# Patient Record
Sex: Female | Born: 1990 | Race: White | Hispanic: No | Marital: Married | Smoking: Never smoker
Health system: Southern US, Community
[De-identification: ages and names within clinical notes are randomized; demographics above are authoritative.]

## PROBLEM LIST (undated history)

## (undated) DIAGNOSIS — I10 Essential (primary) hypertension: Secondary | ICD-10-CM

## (undated) DIAGNOSIS — H9190 Unspecified hearing loss, unspecified ear: Secondary | ICD-10-CM

## (undated) HISTORY — PX: APPENDECTOMY: SHX54

## (undated) HISTORY — PX: OTHER SURGICAL HISTORY: SHX169

## (undated) HISTORY — PX: EAR CYST EXCISION: SHX22

## (undated) HISTORY — DX: Essential (primary) hypertension: I10

## (undated) HISTORY — DX: Unspecified hearing loss, unspecified ear: H91.90

---
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
## 2018-11-01 LAB — HM PAP SMEAR: HM Pap smear: NORMAL

## 2018-12-03 ENCOUNTER — Telehealth: Payer: Self-pay | Admitting: Obstetrics & Gynecology

## 2018-12-03 NOTE — Telephone Encounter (Signed)
[facility] referring for Health maintenance examination. Called and left voicemail for patient to call back to be schedule

## 2018-12-03 NOTE — Patient Instructions (Signed)
I value your feedback and entrusting us with your care. If you get a Deputy patient survey, I would appreciate you taking the time to let us know about your experience today. Thank you! 

## 2018-12-03 NOTE — Progress Notes (Addendum)
PCP:  Patient, No Pcp Per   Chief Complaint  Patient presents with  . Gynecologic Exam     HPI:      Ms. Casey Richardson is a 28 y.o. No obstetric history on file. who LMP was Patient's last menstrual period was 11/14/2018 (exact date)., presents today for her NP annual examination.  Her menses are regular every 28-30 days, lasting 4 days.  Dysmenorrhea mild, occurring first 1-2 days of flow. She does not have intermenstrual bleeding.  Sex activity: never sexually active. Getting married 7/20. Only interested in non-hormonal BC in future. Declines BC currently. Hx of HTN. Last Pap: neg Hx of STDs: none  There is no FH of breast cancer. There is a FH of ovarian cancer in her mat cousin, genetic testing not indicated for pt. The patient does do self-breast exams.  Tobacco use: The patient denies current or previous tobacco use. Alcohol use: social drinker No drug use.  Exercise: moderately active  She does get adequate calcium and Vitamin D in her diet.   Past Medical History:  Diagnosis Date  . HTN (hypertension)     Past Surgical History:  Procedure Laterality Date  . APPENDECTOMY  1999    Family History  Problem Relation Age of Onset  . Thyroid disease Mother   . Esophageal cancer Maternal Grandfather   . Colon cancer Paternal Grandmother   . Esophageal cancer Paternal Grandfather   . Hypertension Paternal Grandfather   . Ovarian cancer Cousin 30    Social History   Socioeconomic History  . Marital status: Single    Spouse name: Not on file  . Number of children: Not on file  . Years of education: Not on file  . Highest education level: Not on file  Occupational History  . Not on file  Social Needs  . Financial resource strain: Not on file  . Food insecurity:    Worry: Not on file    Inability: Not on file  . Transportation needs:    Medical: Not on file    Non-medical: Not on file  Tobacco Use  . Smoking status: Never Smoker  . Smokeless  tobacco: Never Used  Substance and Sexual Activity  . Alcohol use: Yes  . Drug use: Never  . Sexual activity: Never    Birth control/protection: None  Lifestyle  . Physical activity:    Days per week: Not on file    Minutes per session: Not on file  . Stress: Not on file  Relationships  . Social connections:    Talks on phone: Not on file    Gets together: Not on file    Attends religious service: Not on file    Active member of club or organization: Not on file    Attends meetings of clubs or organizations: Not on file    Relationship status: Not on file  . Intimate partner violence:    Fear of current or ex partner: Not on file    Emotionally abused: Not on file    Physically abused: Not on file    Forced sexual activity: Not on file  Other Topics Concern  . Not on file  Social History Narrative  . Not on file    Outpatient Medications Prior to Visit  Medication Sig Dispense Refill  . fluticasone (FLONASE) 50 MCG/ACT nasal spray Place into the nose.    Marland Kitchen. GARLIC OIL PO Take by mouth.    . losartan (COZAAR) 25 MG tablet Take by  mouth.    . Multiple Vitamin (MULTI-VITAMIN DAILY PO) Take by mouth.     No facility-administered medications prior to visit.       ROS:  Review of Systems  Constitutional: Negative for fatigue, fever and unexpected weight change.  Respiratory: Negative for cough, shortness of breath and wheezing.   Cardiovascular: Negative for chest pain, palpitations and leg swelling.  Gastrointestinal: Negative for blood in stool, constipation, diarrhea, nausea and vomiting.  Endocrine: Negative for cold intolerance, heat intolerance and polyuria.  Genitourinary: Negative for dyspareunia, dysuria, flank pain, frequency, genital sores, hematuria, menstrual problem, pelvic pain, urgency, vaginal bleeding, vaginal discharge and vaginal pain.  Musculoskeletal: Negative for back pain, joint swelling and myalgias.  Skin: Negative for rash.  Neurological:  Negative for dizziness, syncope, light-headedness, numbness and headaches.  Hematological: Negative for adenopathy.  Psychiatric/Behavioral: Negative for agitation, confusion, sleep disturbance and suicidal ideas. The patient is not nervous/anxious.    BREAST: No symptoms   Objective: BP 118/80   Ht  (1.702 m)   Wt 225 lb 6.4 oz (102.2 kg)   LMP 11/14/2018 (Exact Date)   BMI 35.30 kg/m    Physical Exam Constitutional:      Appearance: She is well-developed.  Genitourinary:     Vulva, vagina, cervix, uterus, right adnexa and left adnexa normal.     No vulval lesion or tenderness noted.     No vaginal discharge, erythema or tenderness.     No cervical polyp.     Uterus is not enlarged or tender.     No right or left adnexal mass present.     Right adnexa not tender.     Left adnexa not tender.  Neck:     Musculoskeletal: Normal range of motion.     Thyroid: No thyromegaly.  Cardiovascular:     Rate and Rhythm: Normal rate and regular rhythm.     Heart sounds: Normal heart sounds. No murmur.  Pulmonary:     Effort: Pulmonary effort is normal.     Breath sounds: Normal breath sounds.  Chest:     Breasts:        Right: No mass, nipple discharge, skin change or tenderness.        Left: No mass, nipple discharge, skin change or tenderness.  Abdominal:     Palpations: Abdomen is soft.     Tenderness: There is no abdominal tenderness. There is no guarding.  Musculoskeletal: Normal range of motion.  Neurological:     General: No focal deficit present.     Mental Status: She is alert and oriented to person, place, and time.     Cranial Nerves: No cranial nerve deficit.  Skin:    General: Skin is warm and dry.  Psychiatric:        Mood and Affect: Mood normal.        Behavior: Behavior normal.        Thought Content: Thought content normal.        Judgment: Judgment normal.  Vitals signs reviewed.     Assessment/Plan: Encounter for annual routine gynecological  examination  Cervical cancer screening - Plan: Cytology - PAP  Encounter for other general counseling or advice on contraception - Non-hormonal BC options discussed. Pt to f/u prn.          GYN counsel use and side effects of OCP's, adequate intake of calcium and vitamin D     F/U  Return in about 1 year (around 12/04/2019).  Helmut Muster  B. Dalvin Clipper, PA-C 12/04/2018 9:57 AM

## 2018-12-04 ENCOUNTER — Other Ambulatory Visit: Payer: Self-pay

## 2018-12-04 ENCOUNTER — Encounter: Payer: Self-pay | Admitting: Obstetrics and Gynecology

## 2018-12-04 ENCOUNTER — Telehealth: Payer: Self-pay

## 2018-12-04 ENCOUNTER — Ambulatory Visit (INDEPENDENT_AMBULATORY_CARE_PROVIDER_SITE_OTHER): Payer: Self-pay | Admitting: Obstetrics and Gynecology

## 2018-12-04 ENCOUNTER — Other Ambulatory Visit (HOSPITAL_COMMUNITY)
Admission: RE | Admit: 2018-12-04 | Discharge: 2018-12-04 | Disposition: A | Discharge status: Home / Self Care | Payer: Self-pay | Source: Ambulatory Visit | Attending: Obstetrics and Gynecology | Admitting: Obstetrics and Gynecology

## 2018-12-04 VITALS — BP 118/80 | Ht 67.0 in | Wt 225.4 lb

## 2018-12-04 DIAGNOSIS — Z124 Encounter for screening for malignant neoplasm of cervix: Secondary | ICD-10-CM | POA: Insufficient documentation

## 2018-12-04 DIAGNOSIS — Z30011 Encounter for initial prescription of contraceptive pills: Secondary | ICD-10-CM

## 2018-12-04 DIAGNOSIS — Z01419 Encounter for gynecological examination (general) (routine) without abnormal findings: Secondary | ICD-10-CM

## 2018-12-04 DIAGNOSIS — Z3009 Encounter for other general counseling and advice on contraception: Secondary | ICD-10-CM

## 2018-12-04 NOTE — Telephone Encounter (Signed)
Pt calling; had appt this am; bc discussed; thinks she may want a low dose bcp.  [phone]

## 2018-12-04 NOTE — Telephone Encounter (Signed)
Please see below.

## 2018-12-05 MED ORDER — DROSPIRENONE 4 MG PO TABS: 1.0000 | ORAL_TABLET | Freq: Every day | ORAL | 3 refills | Status: DC

## 2018-12-05 NOTE — Telephone Encounter (Signed)
Pt would like BCP. Hx of HTN. Discussed POPs. Will leave 2 samples/coupon card Slynd at front desk to start with next menses. Rx eRxd to [facility]. Start with next menses, condoms for 1 wk. Pt trying to delay menses due to start several days after wedding. Pt to call me when starts pills to see if we need to adjust dosing for honeymoon.

## 2018-12-10 LAB — CYTOLOGY - PAP: Diagnosis: NEGATIVE

## 2018-12-19 ENCOUNTER — Telehealth: Payer: Self-pay

## 2018-12-19 NOTE — Telephone Encounter (Signed)
Pt started slynd 6/14. Wedding sched for 7/10 (during placebo pills). Take placebo pills 1 wk early (01/01/19) and then resume active pills. See if prevents bleeding for wedding/honeymoon. F/u prn.

## 2018-12-19 NOTE — Telephone Encounter (Signed)
Please see

## 2018-12-19 NOTE — Telephone Encounter (Signed)
Pt calling; started bc on Sunday; was told to call the 1st week of taking it.  [phone]

## 2019-01-31 ENCOUNTER — Telehealth: Payer: Self-pay

## 2019-01-31 NOTE — Telephone Encounter (Signed)
Spoke w/pt. Advised to complete current pack and then stop. She doesn't feel well on the birth control. That is the only thing she has changed. Wants to see if she will feel better off the birth control. She will call if she desires to restart birth control.

## 2019-01-31 NOTE — Telephone Encounter (Signed)
Pt would like to stop her OCP's. She is inquiring if there are any specifics she needs to know before stopping them. [phone]

## 2019-10-28 ENCOUNTER — Telehealth: Payer: Self-pay | Admitting: Family Medicine

## 2019-10-28 NOTE — Telephone Encounter (Signed)
Copied from CRM [id number]. Topic: General - Other >> Oct 28, 2019 12:15 PM Dalphine Handing A wrote: Patient stated that she is fully vaccinated and found out that she was exposed to covid . Patient wants to know if she can still attend her appt in person.   Called pt to get more details, she had a bridal shower at her house Friday and found out Sunday one of the girls were positive for Covid, switched NP appt to a virtual

## 2019-10-29 ENCOUNTER — Encounter: Payer: Self-pay | Admitting: Family Medicine

## 2019-10-29 ENCOUNTER — Telehealth (INDEPENDENT_AMBULATORY_CARE_PROVIDER_SITE_OTHER): Payer: Managed Care, Other (non HMO) | Admitting: Family Medicine

## 2019-10-29 VITALS — BP 127/80 | HR 73 | Ht 67.0 in | Wt 237.0 lb

## 2019-10-29 DIAGNOSIS — Z7689 Persons encountering health services in other specified circumstances: Secondary | ICD-10-CM | POA: Diagnosis not present

## 2019-10-29 DIAGNOSIS — I10 Essential (primary) hypertension: Secondary | ICD-10-CM | POA: Insufficient documentation

## 2019-10-29 MED ORDER — LABETALOL HCL 100 MG PO TABS: 50.0000 mg | ORAL_TABLET | Freq: Two times a day (BID) | ORAL | 0 refills | Status: DC

## 2019-10-29 NOTE — Progress Notes (Signed)
BP 127/80   Pulse 73   Ht 5\' 7"  (1.702 m)   Wt 237 lb (107.5 kg)   LMP 10/01/2019 (Exact Date)   BMI 37.12 kg/m    Subjective:    Patient ID: 10/[id number], female    DOB: 11-Aug-1990, 29 y.o.   MRN: [id number]  HPI: Casey Richardson is a 29 y.o. female  Chief Complaint  Patient presents with  . Establish Care    . This visit was completed via MyChart due to the restrictions of the COVID-19 pandemic. All issues as above were discussed and addressed. Physical exam was done as above through visual confirmation on MyChart. If it was felt that the patient should be evaluated in the office, they were directed there. The patient verbally consented to this visit. . Location of the patient: home . Location of the provider: work . Those involved with this call:  . Provider: 26, PA-C . CMA: Roosvelt Maser, CMA . Front Desk/Registration: Elton Sin  . Time spent on call: 15 minutes with patient face to face via video conference. More than 50% of this time was spent in counseling and coordination of care. 5 minutes total spent in review of patient's record and preparation of their chart. I verified patient identity using two factors (patient name and date of birth). Patient consents verbally to being seen via telemedicine visit today.   Patient presenting today to establish care.   PMHx significant for HTN, currently well controlled on low dose losartan. Due for refill, tolerating well without side effects. Of note, wanting to start trying to conceive very soon. Denies CP, SOB, HAs, dizziness.   No other known medical problems and not on any medications otherwise.   Relevant past medical, surgical, family and social history reviewed and updated as indicated. Interim medical history since our last visit reviewed. Allergies and medications reviewed and updated.  Review of Systems  Per HPI unless specifically indicated above     Objective:    BP 127/80   Pulse 73    Ht 5\' 7"  (1.702 m)   Wt 237 lb (107.5 kg)   LMP 10/01/2019 (Exact Date)   BMI 37.12 kg/m   Wt Readings from Last 3 Encounters:  10/29/19 237 lb (107.5 kg)    Physical Exam Vitals and nursing note reviewed.  Constitutional:      General: She is not in acute distress.    Appearance: Normal appearance.  HENT:     Head: Atraumatic.     Right Ear: External ear normal.     Left Ear: External ear normal.     Nose: Nose normal. No congestion.     Mouth/Throat:     Mouth: Mucous membranes are moist.     Pharynx: Oropharynx is clear. No posterior oropharyngeal erythema.  Eyes:     Extraocular Movements: Extraocular movements intact.     Conjunctiva/sclera: Conjunctivae normal.  Cardiovascular:     Comments: Unable to assess via virtual visit Pulmonary:     Effort: Pulmonary effort is normal. No respiratory distress.  Musculoskeletal:        General: Normal range of motion.     Cervical back: Normal range of motion.  Skin:    General: Skin is dry.     Findings: No erythema.  Neurological:     Mental Status: She is alert and oriented to person, place, and time.  Psychiatric:        Mood and Affect: Mood normal.  Thought Content: Thought content normal.        Judgment: Judgment normal.     No results found for this or any previous visit.    Assessment & Plan:   Problem List Items Addressed This Visit      Cardiovascular and Mediastinum   Essential hypertension - Primary    Will transition to labetalol given her desire to conceive in the near future. Check home readings carefully, call with persistent abnormal readings. Recheck 1 month      Relevant Medications   labetalol (NORMODYNE) 100 MG tablet    Other Visit Diagnoses    Encounter to establish care           Follow up plan: Return in about 4 weeks (around 11/26/2019) for BP, CPE.

## 2019-10-29 NOTE — Telephone Encounter (Signed)
Agree with virtual to be safe

## 2019-10-30 ENCOUNTER — Encounter: Payer: Self-pay | Admitting: Family Medicine

## 2019-11-03 ENCOUNTER — Encounter: Payer: Self-pay | Admitting: Family Medicine

## 2019-11-03 ENCOUNTER — Telehealth: Payer: Self-pay | Admitting: Family Medicine

## 2019-11-03 NOTE — Telephone Encounter (Signed)
-----   Message from Particia Nearing, [place] sent at 10/29/2019  5:09 PM EDT ----- 1 month HTN, CPE

## 2019-11-03 NOTE — Telephone Encounter (Signed)
lvm to make this apt. Sent letter.  

## 2019-11-09 NOTE — Assessment & Plan Note (Signed)
Will transition to labetalol given her desire to conceive in the near future. Check home readings carefully, call with persistent abnormal readings. Recheck 1 month

## 2019-11-20 ENCOUNTER — Other Ambulatory Visit: Payer: Self-pay

## 2019-11-20 ENCOUNTER — Encounter: Payer: Self-pay | Admitting: Certified Nurse Midwife

## 2019-11-20 ENCOUNTER — Ambulatory Visit: Payer: Managed Care, Other (non HMO) | Admitting: Certified Nurse Midwife

## 2019-11-20 VITALS — BP 136/78 | HR 66 | Ht 67.0 in | Wt 242.2 lb

## 2019-11-20 DIAGNOSIS — Z7689 Persons encountering health services in other specified circumstances: Secondary | ICD-10-CM

## 2019-11-20 DIAGNOSIS — I10 Essential (primary) hypertension: Secondary | ICD-10-CM

## 2019-11-20 DIAGNOSIS — Z01419 Encounter for gynecological examination (general) (routine) without abnormal findings: Secondary | ICD-10-CM

## 2019-11-20 NOTE — Patient Instructions (Addendum)
Preparing for Pregnancy If you are considering becoming pregnant, make an appointment to see your regular health care provider to learn how to prepare for a safe and healthy pregnancy (preconception care). During a preconception care visit, your health care provider will:  Do a complete physical exam, including a Pap test.  Take a complete medical history.  Give you information, answer your questions, and help you resolve problems. Preconception checklist Medical history  Tell your health care provider about any current or past medical conditions. Your pregnancy or your ability to become pregnant may be affected by chronic conditions, such as diabetes, chronic hypertension, and thyroid problems.  Include your family's medical history as well as your partner's medical history.  Tell your health care provider about any history of STIs (sexually transmitted infections).These can affect your pregnancy. In some cases, they can be passed to your baby. Discuss any concerns that you have about STIs.  If indicated, discuss the benefits of genetic testing. This testing will show whether there are any genetic conditions that may be passed from you or your partner to your baby.  Tell your health care provider about: ? Any problems you have had with conception or pregnancy. ? Any medicines you take. These include vitamins, herbal supplements, and over-the-counter medicines. ? Your history of immunizations. Discuss any vaccinations that you may need. Diet  Ask your health care provider what to include in a healthy diet that has a balance of nutrients. This is especially important when you are pregnant or preparing to become pregnant.  Ask your health care provider to help you reach a healthy weight before pregnancy. ? If you are overweight, you may be at higher risk for certain complications, such as high blood pressure, diabetes, and preterm birth. ? If you are underweight, you are more likely to  have a baby who has a low birth weight. Lifestyle, work, and home  Let your health care provider know: ? About any lifestyle habits that you have, such as alcohol use, drug use, or smoking. ? About recreational activities that may put you at risk during pregnancy, such as downhill skiing and certain exercise programs. ? Tell your health care provider about any international travel, especially any travel to places with an active [place] virus outbreak. ? About harmful substances that you may be exposed to at work or at home. These include chemicals, pesticides, radiation, or even litter boxes. ? If you do not feel safe at home. Mental health  Tell your health care provider about: ? Any history of mental health conditions, including feelings of depression, sadness, or anxiety. ? Any medicines that you take for a mental health condition. These include herbs and supplements. Home instructions to prepare for pregnancy Lifestyle   Eat a balanced diet. This includes fresh fruits and vegetables, whole grains, lean meats, low-fat dairy products, healthy fats, and foods that are high in fiber. Ask to meet with a nutritionist or registered dietitian for assistance with meal planning and goals.  Get regular exercise. Try to be active for at least 30 minutes a day on most days of the week. Ask your health care provider which activities are safe during pregnancy.  Do not use any products that contain nicotine or tobacco, such as cigarettes and e-cigarettes. If you need help quitting, ask your health care provider.  Do not drink alcohol.  Do not take illegal drugs.  Maintain a healthy weight. Ask your health care provider what weight range is right for you. General  instructions  Keep an accurate record of your menstrual periods. This makes it easier for your health care provider to determine your baby's due date.  Begin taking prenatal vitamins and folic acid supplements daily as directed by your  health care provider.  Manage any chronic conditions, such as high blood pressure and diabetes, as told by your health care provider. This is important. How do I know that I am pregnant? You may be pregnant if you have been sexually active and you miss your period. Symptoms of early pregnancy include:  Mild cramping.  Very light vaginal bleeding (spotting).  Feeling unusually tired.  Nausea and vomiting (morning sickness). If you have any of these symptoms and you suspect that you might be pregnant, you can take a home pregnancy test. These tests check for a hormone in your urine (human chorionic gonadotropin, or hCG). A woman's body begins to make this hormone during early pregnancy. These tests are very accurate. Wait until at least the first day after you miss your period to take one. If the test shows that you are pregnant (you get a positive result), call your health care provider to make an appointment for prenatal care. What should I do if I become pregnant?      Make an appointment with your health care provider as soon as you suspect you are pregnant.  Do not use any products that contain nicotine, such as cigarettes, chewing tobacco, and e-cigarettes. If you need help quitting, ask your health care provider.  Do not drink alcoholic beverages. Alcohol is related to a number of birth defects.  Avoid toxic odors and chemicals.  You may continue to have sexual intercourse if it does not cause pain or other problems, such as vaginal bleeding. This information is not intended to replace advice given to you by your health care provider. Make sure you discuss any questions you have with your health care provider. Document Revised: 06/21/2017 Document Reviewed: 01/09/2016 Elsevier Patient Education  2020 Elsevier Inc.    Preventive Care 21-39 Years Old, Female Preventive care refers to visits with your health care provider and lifestyle choices that can promote health and  wellness. This includes:  A yearly physical exam. This may also be called an annual well check.  Regular dental visits and eye exams.  Immunizations.  Screening for certain conditions.  Healthy lifestyle choices, such as eating a healthy diet, getting regular exercise, not using drugs or products that contain nicotine and tobacco, and limiting alcohol use. What can I expect for my preventive care visit? Physical exam Your health care provider will check your:  Height and weight. This may be used to calculate body mass index (BMI), which tells if you are at a healthy weight.  Heart rate and blood pressure.  Skin for abnormal spots. Counseling Your health care provider may ask you questions about your:  Alcohol, tobacco, and drug use.  Emotional well-being.  Home and relationship well-being.  Sexual activity.  Eating habits.  Work and work environment.  Method of birth control.  Menstrual cycle.  Pregnancy history. What immunizations do I need?  Influenza (flu) vaccine  This is recommended every year. Tetanus, diphtheria, and pertussis (Tdap) vaccine  You may need a Td booster every 10 years. Varicella (chickenpox) vaccine  You may need this if you have not been vaccinated. Human papillomavirus (HPV) vaccine  If recommended by your health care provider, you may need three doses over 6 months. Measles, mumps, and rubella (MMR) vaccine    You may need at least one dose of MMR. You may also need a second dose. Meningococcal conjugate (MenACWY) vaccine  One dose is recommended if you are age 19-21 years and a first-year college student living in a residence hall, or if you have one of several medical conditions. You may also need additional booster doses. Pneumococcal conjugate (PCV13) vaccine  You may need this if you have certain conditions and were not previously vaccinated. Pneumococcal polysaccharide (PPSV23) vaccine  You may need one or two doses if you  smoke cigarettes or if you have certain conditions. Hepatitis A vaccine  You may need this if you have certain conditions or if you travel or work in places where you may be exposed to hepatitis A. Hepatitis B vaccine  You may need this if you have certain conditions or if you travel or work in places where you may be exposed to hepatitis B. Haemophilus influenzae type b (Hib) vaccine  You may need this if you have certain conditions. You may receive vaccines as individual doses or as more than one vaccine together in one shot (combination vaccines). Talk with your health care provider about the risks and benefits of combination vaccines. What tests do I need?  Blood tests  Lipid and cholesterol levels. These may be checked every 5 years starting at age 20.  Hepatitis C test.  Hepatitis B test. Screening  Diabetes screening. This is done by checking your blood sugar (glucose) after you have not eaten for a while (fasting).  Sexually transmitted disease (STD) testing.  BRCA-related cancer screening. This may be done if you have a family history of breast, ovarian, tubal, or peritoneal cancers.  Pelvic exam and Pap test. This may be done every 3 years starting at age 21. Starting at age 30, this may be done every 5 years if you have a Pap test in combination with an HPV test. Talk with your health care provider about your test results, treatment options, and if necessary, the need for more tests. Follow these instructions at home: Eating and drinking   Eat a diet that includes fresh fruits and vegetables, whole grains, lean protein, and low-fat dairy.  Take vitamin and mineral supplements as recommended by your health care provider.  Do not drink alcohol if: ? Your health care provider tells you not to drink. ? You are pregnant, may be pregnant, or are planning to become pregnant.  If you drink alcohol: ? Limit how much you have to 0-1 drink a day. ? Be aware of how much  alcohol is in your drink. In the [place] one drink equals one 12 oz bottle of beer (355 mL), one 5 oz glass of wine (148 mL), or one 1 oz glass of hard liquor (44 mL). Lifestyle  Take daily care of your teeth and gums.  Stay active. Exercise for at least 30 minutes on 5 or more days each week.  Do not use any products that contain nicotine or tobacco, such as cigarettes, e-cigarettes, and chewing tobacco. If you need help quitting, ask your health care provider.  If you are sexually active, practice safe sex. Use a condom or other form of birth control (contraception) in order to prevent pregnancy and STIs (sexually transmitted infections). If you plan to become pregnant, see your health care provider for a preconception visit. What's next?  Visit your health care provider once a year for a well check visit.  Ask your health care provider how often you should have   your eyes and teeth checked.  Stay up to date on all vaccines. This information is not intended to replace advice given to you by your health care provider. Make sure you discuss any questions you have with your health care provider. Document Revised: 02/28/2018 Document Reviewed: 02/28/2018 Elsevier Patient Education  2020 Elsevier Inc.  

## 2019-11-20 NOTE — Progress Notes (Signed)
ANNUAL PREVENTATIVE CARE GYN  ENCOUNTER NOTE  Subjective:       Casey Richardson is a 29 y.o. G0P0000 female here for a routine annual gynecologic exam.  Current complaints: 1. Establish care-previously a patient of [facility]  Married last summer. Preparing for pregnancy.   Taking Labetalol twice daily without negative side effects.   Denies difficulty breathing or respiratory distress, chest pain, abdominal pain, excessive vaginal bleeding, dysuria, and leg pain or swelling.    Gynecologic History  Patient's last menstrual period was 10/29/2019 (exact date). Period Cycle (Days): 28 Period Duration (Days): 4 Period Pattern: Regular Menstrual Flow: Heavy, Moderate, Light Menstrual Control: Tampon, Other (Comment) Menstrual Control Change Freq (Hours): 2-3 Dysmenorrhea: (!) Mild Dysmenorrhea Symptoms: Cramping, Headache  Contraception: none  Last Pap: 2020. Results were: normal per patient  Obstetric History  OB History  Gravida Para Term Preterm AB Living  0 0 0 0 0 0  SAB TAB Ectopic Multiple Live Births  0 0 0 0 0    Past Medical History:  Diagnosis Date  . Hearing loss    right   . Hypertension     Past Surgical History:  Procedure Laterality Date  . APPENDECTOMY     20 years  . EAR CYST EXCISION    . nasal polyps      Current Outpatient Medications on File Prior to Visit  Medication Sig Dispense Refill  . labetalol (NORMODYNE) 100 MG tablet Take 0.5 tablets (50 mg total) by mouth 2 (two) times daily. 30 tablet 0   No current facility-administered medications on file prior to visit.    No Known Allergies  Social History   Socioeconomic History  . Marital status: Married    Spouse name: Not on file  . Number of children: Not on file  . Years of education: Not on file  . Highest education level: Not on file  Occupational History  . Not on file  Tobacco Use  . Smoking status: Never Smoker  . Smokeless tobacco: Never Used  Substance  and Sexual Activity  . Alcohol use: Yes  . Drug use: Never  . Sexual activity: Yes    Birth control/protection: None  Other Topics Concern  . Not on file  Social History Narrative  . Not on file   Social Determinants of Health   Financial Resource Strain:   . Difficulty of Paying Living Expenses:   Food Insecurity:   . Worried About Charity fundraiser in the Last Year:   . Arboriculturist in the Last Year:   Transportation Needs:   . Film/video editor (Medical):   Marland Kitchen Lack of Transportation (Non-Medical):   Physical Activity:   . Days of Exercise per Week:   . Minutes of Exercise per Session:   Stress:   . Feeling of Stress :   Social Connections:   . Frequency of Communication with Friends and Family:   . Frequency of Social Gatherings with Friends and Family:   . Attends Religious Services:   . Active Member of Clubs or Organizations:   . Attends Archivist Meetings:   Marland Kitchen Marital Status:   Intimate Partner Violence:   . Fear of Current or Ex-Partner:   . Emotionally Abused:   Marland Kitchen Physically Abused:   . Sexually Abused:     Family History  Problem Relation Age of Onset  . Hyperlipidemia Mother   . Hashimoto's thyroiditis Mother   . Hypothyroidism Mother   . Anxiety  disorder Brother   . Post-traumatic stress disorder Brother   . Cancer Maternal Uncle   . Arthritis Maternal Grandmother   . Cancer Maternal Grandfather   . Cancer Paternal Grandmother   . Anemia Paternal Grandmother   . Cancer Paternal Grandfather   . Aneurysm Maternal Uncle     The following portions of the patient's history were reviewed and updated as appropriate: allergies, current medications, past family history, past medical history, past social history, past surgical history and problem list.  Review of Systems  ROS negative except as noted above. Information obtained from patient.   Objective:   BP 136/78   Pulse 66   Ht 5\' 7"  (1.702 m)   Wt 242 lb 4 oz (109.9 kg)    LMP 10/29/2019 (Exact Date)   BMI 37.94 kg/m    CONSTITUTIONAL: Well-developed, well-nourished female in no acute distress.   PSYCHIATRIC: Normal mood and affect. Normal behavior. Normal judgment and thought content.  NEUROLGIC: Alert and oriented to person, place, and time. Normal muscle tone coordination. No cranial nerve deficit noted.  HENT:  Normocephalic, atraumatic, External right and left ear normal.   EYES: Conjunctivae and EOM are normal. Pupils are equal and round.   NECK: Normal range of motion, supple, no masses.  Normal thyroid.   SKIN: Skin is warm and dry. No rash noted. Not diaphoretic. No erythema. No pallor.  CARDIOVASCULAR: Normal heart rate noted, regular rhythm, no murmur.  RESPIRATORY: Clear to auscultation bilaterally. Effort and breath sounds normal, no problems with respiration noted.  BREASTS: Symmetric in size. No masses, skin changes, nipple drainage, or lymphadenopathy.  ABDOMEN: Soft, normal bowel sounds, no distention noted.  No tenderness, rebound or guarding.   PELVIC:  External Genitalia: Normal  Vagina: Normal  Cervix: Normal  Uterus: Normal  Adnexa: Normal  MUSCULOSKELETAL: Normal range of motion. No tenderness.  No cyanosis, clubbing, or edema.  2+ distal pulses.  LYMPHATIC: No Axillary, Supraclavicular, or Inguinal Adenopathy.  Assessment:   Annual gynecologic examination 29 y.o.   Contraception: none   Obesity 2   Problem List Items Addressed This Visit      Cardiovascular and Mediastinum   Essential hypertension    Other Visit Diagnoses    Well woman exam    -  Primary   Screening for cervical cancer       Encounter to establish care          Plan:   Pap: Not needed  Labs: Managed by PCP   Routine preventative health maintenance measures emphasized: Preparing pregnancy, Exercise/Diet/Weight control, Tobacco Warnings, Alcohol/Substance use risks and Stress Management; see AVS  Reviewed red flag symptoms and when  to call  Return to Clinic - 1 Year for 26 or sooner if needed   [employer], CNM [facility], [facility] 11/20/19 3:53 PM

## 2019-11-23 ENCOUNTER — Other Ambulatory Visit: Payer: Self-pay | Admitting: Family Medicine

## 2019-11-23 NOTE — Telephone Encounter (Signed)
Requested Prescriptions  Pending Prescriptions Disp Refills  . labetalol (NORMODYNE) 100 MG tablet [Pharmacy Med Name: LABETALOL HCL 100 MG TABLET] 30 tablet 0    Sig: TAKE 1/2 TABLET BY MOUTH TWICE A DAY     Cardiovascular:  Beta Blockers Passed - 11/23/2019  1:44 AM      Passed - Last BP in normal range    BP Readings from Last 1 Encounters:  11/20/19 136/78         Passed - Last Heart Rate in normal range    Pulse Readings from Last 1 Encounters:  11/20/19 66         Passed - Valid encounter within last 6 months    Recent Outpatient Visits          3 weeks ago Essential hypertension   [facility] Particia Nearing, [place]      Future Appointments            In 3 days Maurice March, Salley Hews, PA-C [facility]   In 1 year [place], Vanessa [place], CNM [facility]

## 2019-11-26 ENCOUNTER — Ambulatory Visit (INDEPENDENT_AMBULATORY_CARE_PROVIDER_SITE_OTHER): Payer: Managed Care, Other (non HMO) | Admitting: Family Medicine

## 2019-11-26 ENCOUNTER — Encounter: Payer: Self-pay | Admitting: Family Medicine

## 2019-11-26 ENCOUNTER — Other Ambulatory Visit: Payer: Self-pay

## 2019-11-26 VITALS — BP 128/85 | HR 69 | Temp 98.1°F | Wt 241.0 lb

## 2019-11-26 DIAGNOSIS — I10 Essential (primary) hypertension: Secondary | ICD-10-CM | POA: Diagnosis not present

## 2019-11-26 DIAGNOSIS — J309 Allergic rhinitis, unspecified: Secondary | ICD-10-CM | POA: Insufficient documentation

## 2019-11-26 DIAGNOSIS — J3089 Other allergic rhinitis: Secondary | ICD-10-CM

## 2019-11-26 NOTE — Progress Notes (Signed)
BP 128/85   Pulse 69   Temp 98.1 F (36.7 C) (Oral)   Wt 241 lb (109.3 kg)   LMP 10/29/2019 (Exact Date)   SpO2 98%   BMI 37.75 kg/m    Subjective:    Patient ID: Casey Richardson, female    DOB: 1991-05-06, 29 y.o.   MRN: [id number]  HPI: Casey Richardson is a 29 y.o. female  Chief Complaint  Patient presents with  . Hypertension   Here today for 1 month HTN f/u. Recently switched to labetalol as she's trying to get pregnant in near future. Tolerating very well so far, no side effects noted and feeling well. Home readings running 120s/80s range consistently.   Sneezing, dry throat, dry hacking cough since a nearby classroom hatched out baby chicks. She's also been spending more time outside lately. Hx of allergies, notes she started allegra this morning and already feeling much better. Denies fever, chills, SOB, wheezing, sick contacts.   Relevant past medical, surgical, family and social history reviewed and updated as indicated. Interim medical history since our last visit reviewed. Allergies and medications reviewed and updated.  Review of Systems  Per HPI unless specifically indicated above     Objective:    BP 128/85   Pulse 69   Temp 98.1 F (36.7 C) (Oral)   Wt 241 lb (109.3 kg)   LMP 10/29/2019 (Exact Date)   SpO2 98%   BMI 37.75 kg/m   Wt Readings from Last 3 Encounters:  11/26/19 241 lb (109.3 kg)  11/20/19 242 lb 4 oz (109.9 kg)  10/29/19 237 lb (107.5 kg)    Physical Exam Vitals and nursing note reviewed.  Constitutional:      Appearance: Normal appearance. She is not ill-appearing.  HENT:     Head: Atraumatic.     Right Ear: Tympanic membrane normal.     Left Ear: Tympanic membrane normal.     Nose: Nose normal.     Mouth/Throat:     Pharynx: Posterior oropharyngeal erythema present.  Eyes:     Extraocular Movements: Extraocular movements intact.     Conjunctiva/sclera: Conjunctivae normal.  Cardiovascular:     Rate and Rhythm:  Normal rate and regular rhythm.     Heart sounds: Normal heart sounds.  Pulmonary:     Effort: Pulmonary effort is normal.     Breath sounds: Normal breath sounds.  Musculoskeletal:        General: Normal range of motion.     Cervical back: Normal range of motion and neck supple.  Skin:    General: Skin is warm and dry.  Neurological:     Mental Status: She is alert and oriented to person, place, and time.  Psychiatric:        Mood and Affect: Mood normal.        Thought Content: Thought content normal.        Judgment: Judgment normal.     No results found for this or any previous visit.    Assessment & Plan:   Problem List Items Addressed This Visit      Cardiovascular and Mediastinum   Essential hypertension - Primary    Stable and under good control, continue current regimen        Respiratory   Allergic rhinitis    Suspect allergies causing sxs, continue allegra daily, can add flonase as well          Follow up plan: Return in about 6 months (around 05/28/2020) for  BP.

## 2019-11-26 NOTE — Assessment & Plan Note (Signed)
Stable and under good control, continue current regimen 

## 2019-11-26 NOTE — Assessment & Plan Note (Signed)
Suspect allergies causing sxs, continue allegra daily, can add flonase as well

## 2019-12-10 ENCOUNTER — Encounter: Payer: Self-pay | Admitting: Family Medicine

## 2019-12-17 ENCOUNTER — Other Ambulatory Visit: Payer: Self-pay

## 2019-12-17 MED ORDER — LABETALOL HCL 100 MG PO TABS: 50.0000 mg | ORAL_TABLET | Freq: Two times a day (BID) | ORAL | 1 refills | Status: DC

## 2019-12-17 NOTE — Telephone Encounter (Signed)
Needs RX sent to mail order. Last seen 11/26/19 and has appointment 05/31/20

## 2020-01-30 ENCOUNTER — Other Ambulatory Visit: Payer: Self-pay

## 2020-01-30 ENCOUNTER — Ambulatory Visit
Admission: EM | Admit: 2020-01-30 | Discharge: 2020-01-30 | Disposition: A | Discharge status: Home / Self Care | Payer: Managed Care, Other (non HMO) | Attending: Internal Medicine | Admitting: Internal Medicine

## 2020-01-30 ENCOUNTER — Encounter: Payer: Self-pay | Admitting: Family Medicine

## 2020-01-30 DIAGNOSIS — N760 Acute vaginitis: Secondary | ICD-10-CM | POA: Diagnosis not present

## 2020-01-30 LAB — WET PREP, GENITAL
Sperm: NONE SEEN
Trich, Wet Prep: NONE SEEN

## 2020-01-30 MED ORDER — FLUCONAZOLE 150 MG PO TABS
150.0000 mg | ORAL_TABLET | Freq: Once | ORAL | 0 refills | Status: AC
Start: 2020-01-30 — End: 2020-01-30

## 2020-01-30 MED ORDER — METRONIDAZOLE 500 MG PO TABS
500.0000 mg | ORAL_TABLET | Freq: Two times a day (BID) | ORAL | 0 refills | Status: DC
Start: 2020-01-30 — End: 2020-02-14

## 2020-01-30 NOTE — ED Provider Notes (Signed)
[facility]    CSN: [id number] Arrival date & time: 01/30/20  1722      History   Chief Complaint Chief Complaint  Patient presents with   Vaginal Itching    HPI Casey Richardson is a 29 y.o. female comes to the urgent care with a 2-day history of vaginal itching and discharge.  Patient describes discharge as the clear and does not know if it is malodorous or not.  She denies any dysuria urgency or frequency.  No dyspareunia.  She has some vulvar irritation.  No flank pain nausea or vomiting.   HPI  Past Medical History:  Diagnosis Date   Hearing loss    right    Hypertension     Patient Active Problem List   Diagnosis Date Noted   Allergic rhinitis 11/26/2019   Essential hypertension 10/29/2019    Past Surgical History:  Procedure Laterality Date   APPENDECTOMY     20 years   EAR CYST EXCISION     nasal polyps      OB History    Gravida  0   Para  0   Term  0   Preterm  0   AB  0   Living  0     SAB  0   TAB  0   Ectopic  0   Multiple  0   Live Births  0            Home Medications    Prior to Admission medications   Medication Sig Start Date End Date Taking? Authorizing Provider  labetalol (NORMODYNE) 100 MG tablet Take 0.5 tablets (50 mg total) by mouth 2 (two) times daily. 12/17/19  Yes Particia Nearing, PA-C  Prenatal Vit-Fe Fumarate-FA (PRENATAL VITAMINS PO) Take by mouth daily.   Yes [provider]  fluconazole (DIFLUCAN) 150 MG tablet Take 1 tablet (150 mg total) by mouth once for 1 dose. May repeat in 72 hours 01/30/20 01/30/20  Merrilee Jansky, MD  metroNIDAZOLE (FLAGYL) 500 MG tablet Take 1 tablet (500 mg total) by mouth 2 (two) times daily. 01/30/20   Rhaelyn Giron, Britta Mccreedy, MD    Family History Family History  Problem Relation Age of Onset   Hyperlipidemia Mother    Hashimoto's thyroiditis Mother    Hypothyroidism Mother    Anxiety disorder Brother    Post-traumatic stress  disorder Brother    Cancer Maternal Uncle    Arthritis Maternal Grandmother    Cancer Maternal Grandfather    Cancer Paternal Grandmother    Anemia Paternal Grandmother    Cancer Paternal Grandfather    Aneurysm Maternal Uncle     Social History Social History   Tobacco Use   Smoking status: Never Smoker   Smokeless tobacco: Never Used  Building services engineer Use: Never used  Substance Use Topics   Alcohol use: Yes   Drug use: Never     Allergies   Patient has no known allergies.   Review of Systems Review of Systems  Constitutional: Negative.   Eyes: Negative.   Respiratory: Negative.   Genitourinary: Positive for vaginal discharge. Negative for dysuria, frequency, urgency, vaginal bleeding and vaginal pain.     Physical Exam Triage Vital Signs ED Triage Vitals  Enc Vitals Group     BP 01/30/20 1753 (!) 132/88     Pulse Rate 01/30/20 1753 77     Resp 01/30/20 1753 16     Temp 01/30/20 1753 98.9 F (  37.2 C)     Temp Source 01/30/20 1753 Oral     SpO2 01/30/20 1753 99 %     Weight 01/30/20 1752 (!) 235 lb (106.6 kg)     Height 01/30/20 1752 5\' 7"  (1.702 m)     Head Circumference --      Peak Flow --      Pain Score 01/30/20 1751 0     Pain Loc --      Pain Edu? --      Excl. in GC? --    No data found.  Updated Vital Signs BP (!) 132/88 (BP Location: Left Arm)    Pulse 77    Temp 98.9 F (37.2 C) (Oral)    Resp 16    Ht 5\' 7"  (1.702 m)    Wt (!) 106.6 kg    LMP 01/29/2020    SpO2 99%    BMI 36.81 kg/m   Visual Acuity Right Eye Distance:   Left Eye Distance:   Bilateral Distance:    Right Eye Near:   Left Eye Near:    Bilateral Near:     Physical Exam Vitals and nursing note reviewed.  Constitutional:      General: She is not in acute distress.    Appearance: She is not ill-appearing.  Cardiovascular:     Rate and Rhythm: Normal rate and regular rhythm.  Abdominal:     General: Bowel sounds are normal. There is no distension.      Tenderness: There is no abdominal tenderness.     Hernia: No hernia is present.  Neurological:     General: No focal deficit present.     Mental Status: She is alert.      UC Treatments / Results  Labs (all labs ordered are listed, but only abnormal results are displayed) Labs Reviewed  WET PREP, GENITAL - Abnormal; Notable for the following components:      Result Value   Yeast Wet Prep HPF POC PRESENT (*)    Clue Cells Wet Prep HPF POC PRESENT (*)    WBC, Wet Prep HPF POC MODERATE (*)    All other components within normal limits    EKG   Radiology No results found.  Procedures Procedures (including critical care time)  Medications Ordered in UC Medications - No data to display  Initial Impression / Assessment and Plan / UC Course  I have reviewed the triage vital signs and the nursing notes.  Pertinent labs & imaging results that were available during my care of the patient were reviewed by me and considered in my medical decision making (see chart for details).     1.  Vulvovaginitis: Wet prep is significant for clue cells, yeast and moderate white cells Metronidazole 500 mg twice daily for 7 days Fluconazole 250 mg orally x1 dose may be repeated in 72 hours if no improvement in symptoms Return precautions given. Final Clinical Impressions(s) / UC Diagnoses   Final diagnoses:  Vaginitis and vulvovaginitis   Discharge Instructions   None    ED Prescriptions    Medication Sig Dispense Auth. Provider   fluconazole (DIFLUCAN) 150 MG tablet Take 1 tablet (150 mg total) by mouth once for 1 dose. May repeat in 72 hours 2 tablet Hanya Guerin, , MD   metroNIDAZOLE (FLAGYL) 500 MG tablet Take 1 tablet (500 mg total) by mouth 2 (two) times daily. 14 tablet Taisa Deloria, 01/31/2020, MD     PDMP not reviewed this encounter.  Merrilee Jansky, MD 01/30/20 [phone]

## 2020-01-30 NOTE — ED Triage Notes (Signed)
Patient complains of vaginal itching and increase in discharge that started 2 days ago.

## 2020-02-14 ENCOUNTER — Ambulatory Visit
Admission: EM | Admit: 2020-02-14 | Discharge: 2020-02-14 | Disposition: A | Discharge status: Home / Self Care | Payer: Managed Care, Other (non HMO) | Attending: Emergency Medicine | Admitting: Emergency Medicine

## 2020-02-14 ENCOUNTER — Encounter: Payer: Self-pay | Admitting: Gynecology

## 2020-02-14 ENCOUNTER — Other Ambulatory Visit: Payer: Self-pay

## 2020-02-14 DIAGNOSIS — N760 Acute vaginitis: Secondary | ICD-10-CM

## 2020-02-14 DIAGNOSIS — N898 Other specified noninflammatory disorders of vagina: Secondary | ICD-10-CM

## 2020-02-14 DIAGNOSIS — B9689 Other specified bacterial agents as the cause of diseases classified elsewhere: Secondary | ICD-10-CM

## 2020-02-14 LAB — WET PREP, GENITAL
Sperm: NONE SEEN
Trich, Wet Prep: NONE SEEN
Yeast Wet Prep HPF POC: NONE SEEN

## 2020-02-14 MED ORDER — CLINDAMYCIN PHOSPHATE 2 % VA CREA: 1.0000 | TOPICAL_CREAM | Freq: Every day | VAGINAL | 0 refills | Status: AC

## 2020-02-14 NOTE — ED Triage Notes (Signed)
Patient was seen x 2 week ago for BV. Patient stated x 2 days with itching discharged and fishy smells.

## 2020-02-14 NOTE — Discharge Instructions (Addendum)
Recommend trial Clindamycin vaginal cream- 1 applicatorful at bedtime nightly for 7 days. Recommend follow-up with your OB/GYN if symptoms persist.

## 2020-02-16 NOTE — ED Provider Notes (Signed)
[facility]    CSN: [id number] Arrival date & time: 02/14/20  1356      History   Chief Complaint Chief Complaint  Patient presents with  . BV    HPI Casey Richardson is a 29 y.o. female.   29 year old female presents for recheck of vaginal discharge and BV. She was seen here about 2 weeks ago for unusual vaginal discharge and itching and was positive for clue cells and yeast. She was treated with Flagyl orally for 7 days and Diflucan for 1 day and completed all medication as prescribed. Her symptoms had improved for 3 to 4 days but then returned 2 days ago- now she is having slight discharge and fishy odor. Is sexually active with 1 partner (husband) and actively trying to get pregnant. Also had her menses when starting her treatment for BV. Other chronic health issues include HTN and currently on Labetalol and Prenatal vitamins daily.   The history is provided by the patient.    Past Medical History:  Diagnosis Date  . Hearing loss    right   . Hypertension     Patient Active Problem List   Diagnosis Date Noted  . Allergic rhinitis 11/26/2019  . Essential hypertension 10/29/2019    Past Surgical History:  Procedure Laterality Date  . APPENDECTOMY     20 years  . EAR CYST EXCISION    . nasal polyps      OB History    Gravida  0   Para  0   Term  0   Preterm  0   AB  0   Living  0     SAB  0   TAB  0   Ectopic  0   Multiple  0   Live Births  0            Home Medications    Prior to Admission medications   Medication Sig Start Date End Date Taking? Authorizing Provider  labetalol (NORMODYNE) 100 MG tablet Take 0.5 tablets (50 mg total) by mouth 2 (two) times daily. 12/17/19  Yes Particia Nearing, PA-C  Prenatal Vit-Fe Fumarate-FA (PRENATAL VITAMINS PO) Take by mouth daily.   Yes [provider]  clindamycin (CLEOCIN) 2 % vaginal cream Place 1 Applicatorful vaginally at bedtime for 7 days. 02/14/20 02/21/20   Sudie Grumbling, NP    Family History Family History  Problem Relation Age of Onset  . Hyperlipidemia Mother   . Hashimoto's thyroiditis Mother   . Hypothyroidism Mother   . Anxiety disorder Brother   . Post-traumatic stress disorder Brother   . Cancer Maternal Uncle   . Arthritis Maternal Grandmother   . Cancer Maternal Grandfather   . Cancer Paternal Grandmother   . Anemia Paternal Grandmother   . Cancer Paternal Grandfather   . Aneurysm Maternal Uncle     Social History Social History   Tobacco Use  . Smoking status: Never Smoker  . Smokeless tobacco: Never Used  Vaping Use  . Vaping Use: Never used  Substance Use Topics  . Alcohol use: Yes  . Drug use: Never     Allergies   Patient has no known allergies.   Review of Systems Review of Systems  Constitutional: Negative for activity change, appetite change, chills, fatigue and fever.  Gastrointestinal: Negative for abdominal pain, diarrhea, nausea and vomiting.  Genitourinary: Positive for dyspareunia and vaginal discharge. Negative for decreased urine volume, difficulty urinating, dysuria, flank pain, frequency, genital  sores, hematuria, menstrual problem, pelvic pain, urgency, vaginal bleeding and vaginal pain.  Musculoskeletal: Negative for arthralgias, back pain and myalgias.  Skin: Negative for color change, rash and wound.  Allergic/Immunologic: Negative for environmental allergies, food allergies and immunocompromised state.  Neurological: Negative for dizziness, seizures, numbness and headaches.  Hematological: Negative for adenopathy. Does not bruise/bleed easily.  Psychiatric/Behavioral: Negative.      Physical Exam Triage Vital Signs ED Triage Vitals  Enc Vitals Group     BP 02/14/20 1504 125/80     Pulse Rate 02/14/20 1504 71     Resp 02/14/20 1504 18     Temp 02/14/20 1504 98.8 F (37.1 C)     Temp Source 02/14/20 1504 Oral     SpO2 02/14/20 1504 98 %     Weight 02/14/20 1502 230 lb  (104.3 kg)     Height --      Head Circumference --      Peak Flow --      Pain Score 02/14/20 1652 0     Pain Loc --      Pain Edu? --      Excl. in GC? --    No data found.  Updated Vital Signs BP 125/80 (BP Location: Left Arm)   Pulse 71   Temp 98.8 F (37.1 C) (Oral)   Resp 18   Wt 230 lb (104.3 kg)   LMP 01/31/2020   SpO2 98%   BMI 36.02 kg/m   Visual Acuity Right Eye Distance:   Left Eye Distance:   Bilateral Distance:    Right Eye Near:   Left Eye Near:    Bilateral Near:     Physical Exam Vitals and nursing note reviewed.  Constitutional:      General: She is awake. She is not in acute distress.    Appearance: She is well-developed and well-groomed. She is not ill-appearing.     Comments: She is sitting comfortably on the exam table in no acute distress.   HENT:     Head: Normocephalic and atraumatic.  Eyes:     Extraocular Movements: Extraocular movements intact.     Conjunctiva/sclera: Conjunctivae normal.  Cardiovascular:     Rate and Rhythm: Normal rate and regular rhythm.     Heart sounds: Normal heart sounds.  Pulmonary:     Effort: Pulmonary effort is normal.     Breath sounds: Normal breath sounds.  Abdominal:     General: Abdomen is flat. Bowel sounds are normal. There is no distension.     Palpations: Abdomen is soft. There is no mass.     Tenderness: There is no abdominal tenderness. There is no right CVA tenderness, left CVA tenderness, guarding or rebound.     Hernia: No hernia is present.  Genitourinary:    Comments: No pelvic exam performed- patient obtained vaginal self-swab for testing.  Musculoskeletal:        General: Normal range of motion.     Cervical back: Normal range of motion.  Skin:    General: Skin is warm and dry.     Capillary Refill: Capillary refill takes less than 2 seconds.     Findings: No rash.  Neurological:     General: No focal deficit present.     Mental Status: She is alert and oriented to person, place,  and time.  Psychiatric:        Mood and Affect: Mood normal.        Behavior: Behavior normal. Behavior  is cooperative.        Thought Content: Thought content normal.        Judgment: Judgment normal.      UC Treatments / Results  Labs (all labs ordered are listed, but only abnormal results are displayed) Labs Reviewed  WET PREP, GENITAL - Abnormal; Notable for the following components:      Result Value   Clue Cells Wet Prep HPF POC PRESENT (*)    WBC, Wet Prep HPF POC FEW (*)    All other components within normal limits    EKG   Radiology No results found.  Procedures Procedures (including critical care time)  Medications Ordered in UC Medications - No data to display  Initial Impression / Assessment and Plan / UC Course  I have reviewed the triage vital signs and the nursing notes.  Pertinent labs & imaging results that were available during my care of the patient were reviewed by me and considered in my medical decision making (see chart for details).    Reviewed wet prep results with patient- yeast has resolved but clue cells still present with a few WBC's. Recommend trial a different medication for BV- will start Clindamycin 2% vaginal cream- 1 applicator vaginally at bedtime for 7 days. Would recommend no sexual intercourse during treatment. Recommend follow-up with her OB/GYN if symptoms persist.  Final Clinical Impressions(s) / UC Diagnoses   Final diagnoses:  Bacterial vaginosis  Vaginal discharge     Discharge Instructions     Recommend trial Clindamycin vaginal cream- 1 applicatorful at bedtime nightly for 7 days. Recommend follow-up with your OB/GYN if symptoms persist.     ED Prescriptions    Medication Sig Dispense Auth. Provider   clindamycin (CLEOCIN) 2 % vaginal cream Place 1 Applicatorful vaginally at bedtime for 7 days. 40 g Sudie Grumbling, NP     PDMP not reviewed this encounter.   Sudie Grumbling, NP 02/16/20 0045

## 2020-02-26 ENCOUNTER — Other Ambulatory Visit (HOSPITAL_COMMUNITY)
Admission: RE | Admit: 2020-02-26 | Discharge: 2020-02-26 | Disposition: A | Discharge status: Home / Self Care | Payer: Managed Care, Other (non HMO) | Source: Ambulatory Visit | Attending: Certified Nurse Midwife | Admitting: Certified Nurse Midwife

## 2020-02-26 ENCOUNTER — Other Ambulatory Visit: Payer: Self-pay

## 2020-02-26 ENCOUNTER — Encounter: Payer: Self-pay | Admitting: Certified Nurse Midwife

## 2020-02-26 ENCOUNTER — Ambulatory Visit: Payer: Managed Care, Other (non HMO) | Admitting: Certified Nurse Midwife

## 2020-02-26 VITALS — BP 134/89 | HR 68 | Ht 67.0 in | Wt 238.6 lb

## 2020-02-26 DIAGNOSIS — Z8742 Personal history of other diseases of the female genital tract: Secondary | ICD-10-CM | POA: Insufficient documentation

## 2020-02-26 DIAGNOSIS — N898 Other specified noninflammatory disorders of vagina: Secondary | ICD-10-CM

## 2020-02-26 LAB — POCT URINALYSIS DIPSTICK
Bilirubin, UA: NEGATIVE
Blood, UA: NEGATIVE
Glucose, UA: NEGATIVE
Ketones, UA: NEGATIVE
Leukocytes, UA: NEGATIVE
Nitrite, UA: NEGATIVE
Protein, UA: NEGATIVE
Spec Grav, UA: 1.01 (ref 1.010–1.025)
Urobilinogen, UA: 0.2 E.U./dL
pH, UA: 6.5 (ref 5.0–8.0)

## 2020-02-26 NOTE — Patient Instructions (Addendum)
Vaginitis Vaginitis is a condition in which the vaginal tissue swells and becomes red (inflamed). This condition is most often caused by a change in the normal balance of bacteria and yeast that live in the vagina. This change causes an overgrowth of certain bacteria or yeast, which causes the inflammation. There are different types of vaginitis, but the most common types are:  Bacterial vaginosis.  Yeast infection (candidiasis).  Trichomoniasis vaginitis. This is a sexually transmitted disease (STD).  Viral vaginitis.  Atrophic vaginitis.  Allergic vaginitis. What are the causes? The cause of this condition depends on the type of vaginitis. It can be caused by:  Bacteria (bacterial vaginosis).  Yeast, which is a fungus (yeast infection).  A parasite (trichomoniasis vaginitis).  A virus (viral vaginitis).  Low hormone levels (atrophic vaginitis). Low hormone levels can occur during pregnancy, breastfeeding, or after menopause.  Irritants, such as bubble baths, scented tampons, and feminine sprays (allergic vaginitis). Other factors can change the normal balance of the yeast and bacteria that live in the vagina. These include:  Antibiotic medicines.  Poor hygiene.  Diaphragms, vaginal sponges, spermicides, birth control pills, and intrauterine devices (IUD).  Sex.  Infection.  Uncontrolled diabetes.  A weakened defense (immune) system. What increases the risk? This condition is more likely to develop in women who:  Smoke.  Use vaginal douches, scented tampons, or scented sanitary pads.  Wear tight-fitting pants.  Wear thong underwear.  Use oral birth control pills or an IUD.  Have sex without a condom.  Have multiple sex partners.  Have an STD.  Frequently use the spermicide nonoxynol-9.  Eat lots of foods high in sugar.  Have uncontrolled diabetes.  Have low estrogen levels.  Have a weakened immune system from an immune disorder or medical  treatment.  Are pregnant or breastfeeding. What are the signs or symptoms? Symptoms vary depending on the cause of the vaginitis. Common symptoms include:  Abnormal vaginal discharge. ? The discharge is white, gray, or yellow with bacterial vaginosis. ? The discharge is thick, white, and cheesy with a yeast infection. ? The discharge is frothy and yellow or greenish with trichomoniasis.  A bad vaginal smell. The smell is fishy with bacterial vaginosis.  Vaginal itching, pain, or swelling.  Sex that is painful.  Pain or burning when urinating. Sometimes there are no symptoms. How is this diagnosed? This condition is diagnosed based on your symptoms and medical history. A physical exam, including a pelvic exam, will also be done. You may also have other tests, including:  Tests to determine the pH level (acidity or alkalinity) of your vagina.  A whiff test, to assess the odor that results when a sample of your vaginal discharge is mixed with a potassium hydroxide solution.  Tests of vaginal fluid. A sample will be examined under a microscope. How is this treated? Treatment varies depending on the type of vaginitis you have. Your treatment may include:  Antibiotic creams or pills to treat bacterial vaginosis and trichomoniasis.  Antifungal medicines, such as vaginal creams or suppositories, to treat a yeast infection.  Medicine to ease discomfort if you have viral vaginitis. Your sexual partner should also be treated.  Estrogen delivered in a cream, pill, suppository, or vaginal ring to treat atrophic vaginitis. If vaginal dryness occurs, lubricants and moisturizing creams may help. You may need to avoid scented soaps, sprays, or douches.  Stopping use of a product that is causing allergic vaginitis. Then using a vaginal cream to treat the symptoms. Follow   these instructions at home: Lifestyle  Keep your genital area clean and dry. Avoid soap, and only rinse the area with  water.  Do not douche or use tampons until your health care provider says it is okay to do so. Use sanitary pads, if needed.  Do not have sex until your health care provider approves. When you can return to sex, practice safe sex and use condoms.  Wipe from front to back. This avoids the spread of bacteria from the rectum to the vagina. General instructions  Take over-the-counter and prescription medicines only as told by your health care provider.  If you were prescribed an antibiotic medicine, take or use it as told by your health care provider. Do not stop taking or using the antibiotic even if you start to feel better.  Keep all follow-up visits as told by your health care provider. This is important. How is this prevented?  Use mild, non-scented products. Do not use things that can irritate the vagina, such as fabric softeners. Avoid the following products if they are scented: ? Feminine sprays. ? Detergents. ? Tampons. ? Feminine hygiene products. ? Soaps or bubble baths.  Let air reach your genital area. ? Wear cotton underwear to reduce moisture buildup. ? Avoid wearing underwear while you sleep. ? Avoid wearing tight pants and underwear or nylons without a cotton panel. ? Avoid wearing thong underwear.  Take off any wet clothing, such as bathing suits, as soon as possible.  Practice safe sex and use condoms. Contact a health care provider if:  You have abdominal pain.  You have a fever.  You have symptoms that last for more than 2-3 days. Get help right away if:  You have a fever and your symptoms suddenly get worse. Summary  Vaginitis is a condition in which the vaginal tissue becomes inflamed.This condition is most often caused by a change in the normal balance of bacteria and yeast that live in the vagina.  Treatment varies depending on the type of vaginitis you have.  Do not douche, use tampons , or have sex until your health care provider approves. When  you can return to sex, practice safe sex and use condoms. This information is not intended to replace advice given to you by your health care provider. Make sure you discuss any questions you have with your health care provider. Document Revised: 06/01/2017 Document Reviewed: 07/25/2016 Elsevier Patient Education  2020 Elsevier Inc.    Boric Acid vaginal suppository What is this medicine? BORIC ACID (BOHR ik AS id) helps to promote the proper acid balance in the vagina. It is used to help treat yeast infections of the vagina and relieve symptoms such as itching and burning. This medicine may be used for other purposes; ask your health care provider or pharmacist if you have questions. COMMON BRAND NAME(S): Hylafem What should I tell my health care provider before I take this medicine? They need to know if you have any of these conditions:  diabetes  frequent infections  HIV or AIDS  immune system problems  an unusual or allergic reaction to boric acid, other medicines, foods, dyes, or preservatives  pregnant or trying to get pregnant  breast-feeding How should I use this medicine? This medicine is for use in the vagina. Do not take by mouth. Follow the directions on the prescription label. Read package directions carefully before using. Wash hands before and after use. Use this medicine at bedtime, unless otherwise directed by your doctor. Do not   more often than directed. Do not stop using this medicine except on your doctor's advice. Talk to your pediatrician regarding the use of this medicine in children. This medicine is not approved for use in children. Overdosage: If you think you have taken too much of this medicine contact a poison control center or emergency room at once. NOTE: This medicine is only for you. Do not share this medicine with others. What if I miss a dose? If you miss a dose, use it as soon as you can. If it is almost time for your next dose, use  only that dose. Do not use double or extra doses. What may interact with this medicine? Interactions are not expected. Do not use any other vaginal products without telling your doctor or health care professional. This list may not describe all possible interactions. Give your health care provider a list of all the medicines, herbs, non-prescription drugs, or dietary supplements you use. Also tell them if you smoke, drink alcohol, or use illegal drugs. Some items may interact with your medicine. What should I watch for while using this medicine? Tell your doctor or health care professional if your symptoms do not start to get better within a few days. It is better not to have sex until you have finished your treatment. This medicine may damage condoms or diaphragms and cause them not to work properly. It may also decrease the effect of vaginal spermicides. Do not rely on any of these methods to prevent sexually transmitted diseases or pregnancy while you are using this medicine. Vaginal medicines usually will come out of the vagina during treatment. To keep the medicine from getting on your clothing, wear a panty liner. The use of tampons is not recommended. To help clear up the infection, wear freshly washed cotton, not synthetic, underwear. What side effects may I notice from receiving this medicine? Side effects that you should report to your doctor or health care professional as soon as possible:  allergic reactions like skin rash, itching or hives  vaginal irritation, redness, or burning Side effects that usually do not require medical attention (report to your doctor or health care professional if they continue or are bothersome):  vaginal discharge This list may not describe all possible side effects. Call your doctor for medical advice about side effects. You may report side effects to FDA at 1-800-FDA-1088. Where should I keep my medicine? Keep out of the reach of children. Store in a  cool, dry place between 15 and 30 degrees C (59 and 86 degrees F). Keep away from sunlight. Throw away any unused medicine after the expiration date. NOTE: This sheet is a summary. It may not cover all possible information. If you have questions about this medicine, talk to your doctor, pharmacist, or health care provider.  2020 Elsevier/Gold Standard (2015-07-22 07:29:58)

## 2020-02-26 NOTE — Progress Notes (Signed)
GYN ENCOUNTER NOTE  Subjective:       Casey Richardson is a 29 y.o. G0P0000 female is here for gynecologic evaluation of the following issues:  1. Vaginal itching since Monday  Seen at urgent care twice in the last month (vaginal itching and discharge) and treated for both yeast and bacterial vaginosis.   Finished last treatment on Friday and symptoms returned.   Denies difficulty breathing or respiratory distress, chest pain, abdominal pain, excessive vaginal bleeding, dysuria, and leg pain or swelling.    Gynecologic History  Patient's last menstrual period was 01/31/2020 (exact date).  Contraception: none  Last Pap: 2020. Results were: normal per patient  Obstetric History  OB History  Gravida Para Term Preterm AB Living  0 0 0 0 0 0  SAB TAB Ectopic Multiple Live Births  0 0 0 0 0    Past Medical History:  Diagnosis Date  . Hearing loss    right   . Hypertension     Past Surgical History:  Procedure Laterality Date  . APPENDECTOMY     20 years  . EAR CYST EXCISION    . nasal polyps      Current Outpatient Medications on File Prior to Visit  Medication Sig Dispense Refill  . labetalol (NORMODYNE) 100 MG tablet Take 0.5 tablets (50 mg total) by mouth 2 (two) times daily. 90 tablet 1  . Prenatal Vit-Fe Fumarate-FA (PRENATAL VITAMINS PO) Take by mouth daily.     No current facility-administered medications on file prior to visit.    No Known Allergies  Social History   Socioeconomic History  . Marital status: Married    Spouse name: Not on file  . Number of children: Not on file  . Years of education: Not on file  . Highest education level: Not on file  Occupational History  . Not on file  Tobacco Use  . Smoking status: Never Smoker  . Smokeless tobacco: Never Used  Vaping Use  . Vaping Use: Never used  Substance and Sexual Activity  . Alcohol use: Yes    Comment: rare  . Drug use: Never  . Sexual activity: Yes    Birth control/protection:  None  Other Topics Concern  . Not on file  Social History Narrative  . Not on file   Social Determinants of Health   Financial Resource Strain:   . Difficulty of Paying Living Expenses: Not on file  Food Insecurity:   . Worried About Programme researcher, broadcasting/film/video in the Last Year: Not on file  . Ran Out of Food in the Last Year: Not on file  Transportation Needs:   . Lack of Transportation (Medical): Not on file  . Lack of Transportation (Non-Medical): Not on file  Physical Activity:   . Days of Exercise per Week: Not on file  . Minutes of Exercise per Session: Not on file  Stress:   . Feeling of Stress : Not on file  Social Connections:   . Frequency of Communication with Friends and Family: Not on file  . Frequency of Social Gatherings with Friends and Family: Not on file  . Attends Religious Services: Not on file  . Active Member of Clubs or Organizations: Not on file  . Attends Banker Meetings: Not on file  . Marital Status: Not on file  Intimate Partner Violence:   . Fear of Current or Ex-Partner: Not on file  . Emotionally Abused: Not on file  . Physically Abused: Not on  file  . Sexually Abused: Not on file    Family History  Problem Relation Age of Onset  . Hyperlipidemia Mother   . Hashimoto's thyroiditis Mother   . Hypothyroidism Mother   . Anxiety disorder Brother   . Post-traumatic stress disorder Brother   . Cancer Maternal Uncle   . Arthritis Maternal Grandmother   . Cancer Maternal Grandfather   . Cancer Paternal Grandmother   . Anemia Paternal Grandmother   . Cancer Paternal Grandfather   . Aneurysm Maternal Uncle     The following portions of the patient's history were reviewed and updated as appropriate: allergies, current medications, past family history, past medical history, past social history, past surgical history and problem list.  Review of Systems  ROS negative except as noted above. Information obtained from patient.   Objective:    BP 134/89   Pulse 68   Ht 5\' 7"  (1.702 m)   Wt 238 lb 9.6 oz (108.2 kg)   LMP 01/31/2020 (Exact Date)   BMI 37.37 kg/m    CONSTITUTIONAL: Well-developed, well-nourished female in no acute distress.  HENT:  Normocephalic, atraumatic.   PELVIC:  External Genitalia: Erythema present  Vagina: White, yellowish thick discharge present  Cervix: Normal   MUSCULOSKELETAL: Normal range of motion. No tenderness.  No cyanosis, clubbing, or edema.   Assessment:   1. Vaginal itching  - Cervicovaginal ancillary only  2. Vaginal discharge  - Cervicovaginal ancillary only  3. History of vaginitis  - Cervicovaginal ancillary only    Plan:   Vaginal swab collected, see orders. Will contact patient with results.   Samples of boric acid samples provided.   Reviewed red flag symptoms and when to call.   RTC if symptoms worsen or fail to improve.    02/02/2020, CNM [facility], [facility] 02/28/20 3:09 PM

## 2020-02-27 LAB — CERVICOVAGINAL ANCILLARY ONLY
Bacterial Vaginitis (gardnerella): NEGATIVE
Candida Glabrata: NEGATIVE
Candida Vaginitis: POSITIVE — AB
Comment: NEGATIVE
Comment: NEGATIVE
Comment: NEGATIVE
Comment: NEGATIVE
Trichomonas: NEGATIVE

## 2020-03-02 ENCOUNTER — Telehealth: Payer: Self-pay | Admitting: Certified Nurse Midwife

## 2020-03-02 NOTE — Telephone Encounter (Signed)
The pt called in and stated that she didn't know how long it would be till she heard back. I told the pt I saw were the nurse forwarded the message to the provider and I told her that im sorry but Marcelino Duster is out of the office on Tuesday and wednesdays. The pt verbally understood and I told her that if your provider is on call she will reply to your message.

## 2020-05-24 ENCOUNTER — Other Ambulatory Visit: Payer: Self-pay | Admitting: Family Medicine

## 2020-05-31 ENCOUNTER — Other Ambulatory Visit: Payer: Self-pay

## 2020-05-31 ENCOUNTER — Ambulatory Visit: Payer: Managed Care, Other (non HMO) | Admitting: Family Medicine

## 2020-05-31 ENCOUNTER — Ambulatory Visit (INDEPENDENT_AMBULATORY_CARE_PROVIDER_SITE_OTHER): Payer: Managed Care, Other (non HMO) | Admitting: Nurse Practitioner

## 2020-05-31 ENCOUNTER — Encounter: Payer: Self-pay | Admitting: Nurse Practitioner

## 2020-05-31 VITALS — BP 137/80 | HR 56 | Temp 98.6°F | Wt 234.0 lb

## 2020-05-31 DIAGNOSIS — I10 Essential (primary) hypertension: Secondary | ICD-10-CM | POA: Diagnosis not present

## 2020-05-31 NOTE — Assessment & Plan Note (Addendum)
Chronic, stable.  BP slightly elevated above goal of 130/80 in office today, however BP at home below goal.  Will continue current regimen with labetalol 50 mg twice daily for now.  Encouraged DASH diet and to monitor BP at home.  If >130/80 consistently, return to clinic.  Follow up in 6 months.

## 2020-05-31 NOTE — Progress Notes (Signed)
BP 137/80   Pulse (!) 56   Temp 98.6 F (37 C)   Wt 234 lb (106.1 kg)   SpO2 99%   BMI 36.65 kg/m    Subjective:    Patient ID: Casey Richardson, female    DOB: 07/02/1991, 29 y.o.   MRN: [id number]  HPI: Casey Richardson is a 29 y.o. female presenting for hypertension follow up.  Chief Complaint  Patient presents with  . Hypertension   HYPERTENSION Currently taking labetalol 50 mg twice daily. Hypertension status: controlled  Satisfied with current treatment? no Duration of hypertension: chronic BP monitoring frequency:  not checking BP range: 120s/70s-80s BP medication side effects:  no Medication compliance: excellent compliance Aspirin: no Recurrent headaches: no Visual changes: no Palpitations: no Dyspnea: no Chest pain: no Lower extremity edema: no Dizzy/lightheaded: no  No Known Allergies  Outpatient Encounter Medications as of 05/31/2020  Medication Sig  . labetalol (NORMODYNE) 100 MG tablet TAKE ONE-HALF (1/2) TABLET TWICE A DAY  . Prenatal Vit-Fe Fumarate-FA (PRENATAL VITAMINS PO) Take by mouth daily.   No facility-administered encounter medications on file as of 05/31/2020.   Patient Active Problem List   Diagnosis Date Noted  . Allergic rhinitis 11/26/2019  . Essential hypertension 10/29/2019   Past Medical History:  Diagnosis Date  . Hearing loss    right   . Hypertension    Relevant past medical, surgical, family and social history reviewed and updated as indicated. Interim medical history since our last visit reviewed.  Review of Systems  Constitutional: Negative.   Eyes: Negative.  Negative for visual disturbance.  Respiratory: Negative.  Negative for chest tightness, shortness of breath and wheezing.   Cardiovascular: Negative.  Negative for chest pain, palpitations and leg swelling.  Skin: Negative.   Neurological: Negative.  Negative for dizziness, weakness and headaches.  Psychiatric/Behavioral: Negative.     Per HPI  unless specifically indicated above     Objective:    BP 137/80   Pulse (!) 56   Temp 98.6 F (37 C)   Wt 234 lb (106.1 kg)   SpO2 99%   BMI 36.65 kg/m   Wt Readings from Last 3 Encounters:  05/31/20 234 lb (106.1 kg)  02/26/20 238 lb 9.6 oz (108.2 kg)  02/14/20 230 lb (104.3 kg)    Physical Exam Vitals and nursing note reviewed.  Constitutional:      General: She is not in acute distress.    Appearance: Normal appearance. She is obese. She is not toxic-appearing.  Eyes:     General: No scleral icterus.       Right eye: No discharge.        Left eye: No discharge.     Extraocular Movements: Extraocular movements intact.  Cardiovascular:     Rate and Rhythm: Regular rhythm. Bradycardia present.     Pulses: Normal pulses.     Heart sounds: Normal heart sounds. No murmur heard.   Pulmonary:     Effort: Pulmonary effort is normal.     Breath sounds: Normal breath sounds. No wheezing, rhonchi or rales.  Musculoskeletal:     Cervical back: Normal range of motion.     Right lower leg: No edema.     Left lower leg: No edema.  Lymphadenopathy:     Cervical: No cervical adenopathy.  Skin:    General: Skin is warm and dry.     Coloration: Skin is not jaundiced or pale.     Findings: No erythema.  Neurological:  General: No focal deficit present.     Mental Status: She is alert and oriented to person, place, and time.     Motor: No weakness.     Gait: Gait normal.  Psychiatric:        Mood and Affect: Mood normal.        Behavior: Behavior normal.        Thought Content: Thought content normal.        Judgment: Judgment normal.       Assessment & Plan:   Problem List Items Addressed This Visit      Cardiovascular and Mediastinum   Essential hypertension - Primary    Chronic, stable.  BP slightly elevated above goal of 130/80 in office today, however BP at home below goal.  Will continue current regimen with labetalol 50 mg twice daily for now.  Encouraged DASH  diet and to monitor BP at home.  If >130/80 consistently, return to clinic.  Follow up in 6 months.           Follow up plan: Return in about 6 months (around 11/28/2020) for BP f/u.

## 2020-05-31 NOTE — Patient Instructions (Signed)
DASH Eating Plan DASH stands for "Dietary Approaches to Stop Hypertension." The DASH eating plan is a healthy eating plan that has been shown to reduce high blood pressure (hypertension). It may also reduce your risk for type 2 diabetes, heart disease, and stroke. The DASH eating plan may also help with weight loss. What are tips for following this plan?  General guidelines  Avoid eating more than 2,300 mg (milligrams) of salt (sodium) a day. If you have hypertension, you may need to reduce your sodium intake to 1,500 mg a day.  Limit alcohol intake to no more than 1 drink a day for nonpregnant women and 2 drinks a day for men. One drink equals 12 oz of beer, 5 oz of wine, or 1 oz of hard liquor.  Work with your health care provider to maintain a healthy body weight or to lose weight. Ask what an ideal weight is for you.  Get at least 30 minutes of exercise that causes your heart to beat faster (aerobic exercise) most days of the week. Activities may include walking, swimming, or biking.  Work with your health care provider or diet and nutrition specialist (dietitian) to adjust your eating plan to your individual calorie needs. Reading food labels   Check food labels for the amount of sodium per serving. Choose foods with less than 5 percent of the Daily Value of sodium. Generally, foods with less than 300 mg of sodium per serving fit into this eating plan.  To find whole grains, look for the word "whole" as the first word in the ingredient list. Shopping  Buy products labeled as "low-sodium" or "no salt added."  Buy fresh foods. Avoid canned foods and premade or frozen meals. Cooking  Avoid adding salt when cooking. Use salt-free seasonings or herbs instead of table salt or sea salt. Check with your health care provider or pharmacist before using salt substitutes.  Do not fry foods. Cook foods using healthy methods such as baking, boiling, grilling, and broiling instead.  Cook with  heart-healthy oils, such as olive, canola, soybean, or sunflower oil. Meal planning  Eat a balanced diet that includes: ? 5 or more servings of fruits and vegetables each day. At each meal, try to fill half of your plate with fruits and vegetables. ? Up to 6-8 servings of whole grains each day. ? Less than 6 oz of lean meat, poultry, or fish each day. A 3-oz serving of meat is about the same size as a deck of cards. One egg equals 1 oz. ? 2 servings of low-fat dairy each day. ? A serving of nuts, seeds, or beans 5 times each week. ? Heart-healthy fats. Healthy fats called Omega-3 fatty acids are found in foods such as flaxseeds and coldwater fish, like sardines, salmon, and mackerel.  Limit how much you eat of the following: ? Canned or prepackaged foods. ? Food that is high in trans fat, such as fried foods. ? Food that is high in saturated fat, such as fatty meat. ? Sweets, desserts, sugary drinks, and other foods with added sugar. ? Full-fat dairy products.  Do not salt foods before eating.  Try to eat at least 2 vegetarian meals each week.  Eat more home-cooked food and less restaurant, buffet, and fast food.  When eating at a restaurant, ask that your food be prepared with less salt or no salt, if possible. What foods are recommended? The items listed may not be a complete list. Talk with your dietitian about   what dietary choices are best for you. Grains Whole-grain or whole-wheat bread. Whole-grain or whole-wheat pasta. Brown rice. Oatmeal. Quinoa. Bulgur. Whole-grain and low-sodium cereals. Pita bread. Low-fat, low-sodium crackers. Whole-wheat flour tortillas. Vegetables Fresh or frozen vegetables (raw, steamed, roasted, or grilled). Low-sodium or reduced-sodium tomato and vegetable juice. Low-sodium or reduced-sodium tomato sauce and tomato paste. Low-sodium or reduced-sodium canned vegetables. Fruits All fresh, dried, or frozen fruit. Canned fruit in natural juice (without  added sugar). Meat and other protein foods Skinless chicken or turkey. Ground chicken or turkey. Pork with fat trimmed off. Fish and seafood. Egg whites. Dried beans, peas, or lentils. Unsalted nuts, nut butters, and seeds. Unsalted canned beans. Lean cuts of beef with fat trimmed off. Low-sodium, lean deli meat. Dairy Low-fat (1%) or fat-free (skim) milk. Fat-free, low-fat, or reduced-fat cheeses. Nonfat, low-sodium ricotta or cottage cheese. Low-fat or nonfat yogurt. Low-fat, low-sodium cheese. Fats and oils Soft margarine without trans fats. Vegetable oil. Low-fat, reduced-fat, or light mayonnaise and salad dressings (reduced-sodium). Canola, safflower, olive, soybean, and sunflower oils. Avocado. Seasoning and other foods Herbs. Spices. Seasoning mixes without salt. Unsalted popcorn and pretzels. Fat-free sweets. What foods are not recommended? The items listed may not be a complete list. Talk with your dietitian about what dietary choices are best for you. Grains Baked goods made with fat, such as croissants, muffins, or some breads. Dry pasta or rice meal packs. Vegetables Creamed or fried vegetables. Vegetables in a cheese sauce. Regular canned vegetables (not low-sodium or reduced-sodium). Regular canned tomato sauce and paste (not low-sodium or reduced-sodium). Regular tomato and vegetable juice (not low-sodium or reduced-sodium). Pickles. Olives. Fruits Canned fruit in a light or heavy syrup. Fried fruit. Fruit in cream or butter sauce. Meat and other protein foods Fatty cuts of meat. Ribs. Fried meat. Bacon. Sausage. Bologna and other processed lunch meats. Salami. Fatback. Hotdogs. Bratwurst. Salted nuts and seeds. Canned beans with added salt. Canned or smoked fish. Whole eggs or egg yolks. Chicken or turkey with skin. Dairy Whole or 2% milk, cream, and half-and-half. Whole or full-fat cream cheese. Whole-fat or sweetened yogurt. Full-fat cheese. Nondairy creamers. Whipped toppings.  Processed cheese and cheese spreads. Fats and oils Butter. Stick margarine. Lard. Shortening. Ghee. Bacon fat. Tropical oils, such as coconut, palm kernel, or palm oil. Seasoning and other foods Salted popcorn and pretzels. Onion salt, garlic salt, seasoned salt, table salt, and sea salt. Worcestershire sauce. Tartar sauce. Barbecue sauce. Teriyaki sauce. Soy sauce, including reduced-sodium. Steak sauce. Canned and packaged gravies. Fish sauce. Oyster sauce. Cocktail sauce. Horseradish that you find on the shelf. Ketchup. Mustard. Meat flavorings and tenderizers. Bouillon cubes. Hot sauce and Tabasco sauce. Premade or packaged marinades. Premade or packaged taco seasonings. Relishes. Regular salad dressings. Where to find more information:  National Heart, Lung, and Blood Institute: www.nhlbi.nih.gov  American Heart Association: www.heart.org Summary  The DASH eating plan is a healthy eating plan that has been shown to reduce high blood pressure (hypertension). It may also reduce your risk for type 2 diabetes, heart disease, and stroke.  With the DASH eating plan, you should limit salt (sodium) intake to 2,300 mg a day. If you have hypertension, you may need to reduce your sodium intake to 1,500 mg a day.  When on the DASH eating plan, aim to eat more fresh fruits and vegetables, whole grains, lean proteins, low-fat dairy, and heart-healthy fats.  Work with your health care provider or diet and nutrition specialist (dietitian) to adjust your eating plan to your   individual calorie needs. This information is not intended to replace advice given to you by your health care provider. Make sure you discuss any questions you have with your health care provider. Document Revised: 06/01/2017 Document Reviewed: 06/12/2016 Elsevier Patient Education  2020 [employer]  

## 2020-06-14 ENCOUNTER — Ambulatory Visit: Payer: Managed Care, Other (non HMO) | Admitting: Certified Nurse Midwife

## 2020-06-14 ENCOUNTER — Other Ambulatory Visit: Payer: Self-pay

## 2020-06-14 ENCOUNTER — Encounter: Payer: Self-pay | Admitting: Certified Nurse Midwife

## 2020-06-14 VITALS — BP 130/78 | HR 65 | Ht 67.0 in | Wt 233.7 lb

## 2020-06-14 DIAGNOSIS — I10 Essential (primary) hypertension: Secondary | ICD-10-CM | POA: Diagnosis not present

## 2020-06-14 DIAGNOSIS — N926 Irregular menstruation, unspecified: Secondary | ICD-10-CM

## 2020-06-14 DIAGNOSIS — Z3201 Encounter for pregnancy test, result positive: Secondary | ICD-10-CM | POA: Diagnosis not present

## 2020-06-14 LAB — POCT URINE PREGNANCY: Preg Test, Ur: POSITIVE — AB

## 2020-06-14 NOTE — Progress Notes (Signed)
GYN Encounter Note  Subjective:  Para Casey "Casey Contras" Richardson is a 29 year old female here for confirmation of planned pregnancy.   Reports positive home pregnancy test.   Endorses occasional nausea without vomiting, mild lowe abdominal cramping and breast tenderness.   Taking prenatal vitamin with folic acid and DHA along with labetalol daily.   Denies bleeding, headaches, difficulty breathing or respiratory distress, chest pain, or lower extremity swelling at this time.  Chief Complaint  Chief Complaint  Patient presents with  . Amenorrhea   Patient's last menstrual period was 05/03/2020. Period Pattern: Regular   Estimated due date: 02/07/2021  Gestational age: 78 weeks 0 days  Past Medical History:  Diagnosis Date  . Hearing loss    right   . Hypertension     Social History   Tobacco Use  . Smoking status: Never Smoker  . Smokeless tobacco: Never Used  Substance Use Topics  . Alcohol use: Not Currently    Comment: rare     Current Outpatient Medications:  .  labetalol (NORMODYNE) 100 MG tablet, TAKE ONE-HALF (1/2) TABLET TWICE A DAY, Disp: 90 tablet, Rfl: 3 .  Prenatal Vit-Fe Fumarate-FA (PRENATAL VITAMINS PO), Take by mouth daily., Disp: , Rfl:   No Known Allergies  ROS   Negative except what is noted above. Information obtained from patient.   Objective  BP 130/78   Pulse 65   Ht 5\' 7"  (1.702 m)   Wt 233 lb 11.2 oz (106 kg)   LMP 05/03/2020   BMI 36.60 kg/m   PHYSICAL EXAM: Not indicated.   Recent Results (from the past 2160 hour(s))  POCT urine pregnancy     Status: Abnormal   Collection Time: 06/14/20  3:59 PM  Result Value Ref Range   Preg Test, Ur Positive (A) Negative    Assessment:  1. Missed menses 2. Essential Hypertension 3. Positive pregnancy test  Plan  First trimester education, see AVS.   Questions answered and book recommendations given. .   Encouraged healthy lifestyle and continuation of current medications.    Reviewed red flag symptoms and when to call.   RTC x 2 weeks for dating/viablility & intake; RTC x 5-6 weeks for NOB PE with JML or sooner if needed.  06/16/20, Student-MidWife [employer] 06/14/20 4:59 PM

## 2020-06-14 NOTE — Progress Notes (Signed)
I have seen, interviewed, and examined the patient in conjunction with the [employer] student midwife and affirm the diagnosis and management plan.   Macdonald Rigor Michelle Creed Kail, CNM [facility] 06/14/20 5:23 PM  

## 2020-06-14 NOTE — Patient Instructions (Signed)
Healthy Weight Gain During Pregnancy, Adult A certain amount of weight gain during pregnancy is normal and healthy. How much weight you should gain depends on your overall health and a measurement called BMI (body mass index). BMI is an estimate of your body fat based on your height and weight. You can use an online calculator to figure out your BMI, or you can ask your health care provider to calculate it for you at your next visit. Your recommended pregnancy weight gain is based on your pre-pregnancy BMI. General guidelines for a healthy total weight gain during pregnancy are listed below. If your BMI at or before the start of your pregnancy is:  Less than 18.5 (underweight), you should gain 28-40 lb (13-18 kg).  18.5-24.9 (normal weight), you should gain 25-35 lb (11-16 kg).  25-29.9 (overweight), you should gain 15-25 lb (7-11 kg).  30 or higher (obese), you should gain 11-20 lb (5-9 kg). These ranges vary depending on your individual health. If you are carrying more than one baby (multiples), it may be safe to gain more weight than these recommendations. If you gain less weight than recommended, that may be safe as long as your baby is growing and developing normally. How can unhealthy weight gain affect me and my baby? Gaining too much weight during pregnancy can lead to pregnancy complications, such as:  A temporary form of diabetes that develops during pregnancy (gestational diabetes).  High blood pressure during pregnancy and protein in your urine (preeclampsia).  High blood pressure during pregnancy without protein in your urine (gestational hypertension).  Your baby having a high weight at birth, which may: ? Raise your risk of having a more difficult delivery or a surgical delivery (cesarean delivery, or C-section). ? Raise your child's risk of developing obesity during childhood. Not gaining enough weight can be life-threatening for your baby, and it may raise your baby's chances  of:  Being born early (preterm).  Growing more slowly than normal during pregnancy (growth restriction).  Having a low weight at birth. What actions can I take to gain a healthy amount of weight during pregnancy? General instructions  Keep track of your weight gain during pregnancy.  Take over-the-counter and prescription medicines only as told by your health care provider. Take all prenatal supplements as directed.  Keep all health care visits during pregnancy (prenatal visits). These visits are a good time to discuss your weight gain. Your health care provider will weigh you at each visit to make sure you are gaining a healthy amount of weight. Nutrition   Eat a balanced, nutrient-rich diet. Eat plenty of: ? Fruits and vegetables, such as berries and broccoli. ? Whole grains, such as millet, barley, whole-wheat breads and cereals, and oatmeal. ? Low-fat dairy products or non-dairy products such as almond milk or rice milk. ? Protein foods, such as lean meat, chicken, eggs, and legumes (such as peas, beans, soybeans, and lentils).  Avoid foods that are fried or have a lot of fat, salt (sodium), or sugar.  Drink enough fluid to keep your urine pale yellow.  Choose healthy snack and drink options when you are at work or on the go: ? Drink water. Avoid soda, sports drinks, and juices that have added sugar. ? Avoid drinks with caffeine, such as coffee and energy drinks. ? Eat snacks that are high in protein, such as nuts, protein bars, and low-fat yogurt. ? Carry convenient snacks in your purse that do not need refrigeration, such as a pack of   trail mix, an apple, or a granola bar.  If you need help improving your diet, work with a health care provider or a diet and nutrition specialist (dietitian). Activity   Exercise regularly, as told by your health care provider. ? If you were active before becoming pregnant, you may be able to continue your regular fitness activities. ? If  you were not active before pregnancy, you may gradually build up to exercising for 30 or more minutes on most days of the week. This may include walking, swimming, or yoga.  Ask your health care provider what activities are safe for you. Talk with your health care provider about whether you may need to be excused from certain school or work activities. Where to find more information Learn more about managing your weight gain during pregnancy from:  American Pregnancy Association: www.americanpregnancy.org  [place] Department of Agriculture pregnancy weight gain calculator: [employer] Summary  Too much weight gain during pregnancy can lead to complications for you and your baby.  Find out your pre-pregnancy BMI to determine how much weight gain is healthy for you.  Eat nutritious foods and stay active.  Keep all of your prenatal visits as told by your health care provider. This information is not intended to replace advice given to you by your health care provider. Make sure you discuss any questions you have with your health care provider. Document Revised: 03/12/2019 Document Reviewed: 03/09/2017 Elsevier Patient Education  [place].   Exercise During Pregnancy Exercise is an important part of being healthy for people of all ages. Exercise improves the function of your heart and lungs and helps you maintain strength, flexibility, and a healthy body weight. Exercise also boosts energy levels and elevates mood. Most women should exercise regularly during pregnancy. In rare cases, women with certain medical conditions or complications may be asked to limit or avoid exercise during pregnancy. How does this affect me? Along with maintaining general strength and flexibility, exercising during pregnancy can help:  Keep strength in muscles that are used during labor and childbirth.  Decrease low back pain.  Reduce symptoms of depression.  Control weight gain during  pregnancy.  Reduce the risk of needing insulin if you develop diabetes during pregnancy.  Decrease the risk of cesarean delivery.  Speed up your recovery after giving birth. How does this affect my baby? Exercise can help you have a healthy pregnancy. Exercise does not cause premature birth. It will not cause your baby to weigh less at birth. What exercises can I do? Many exercises are safe for you to do during pregnancy. Do a variety of exercises that safely increase your heart and breathing rates and help you build and maintain muscle strength. Do exercises exactly as told by your health care provider. You may do these exercises:  Walking or hiking.  Swimming.  Water aerobics.  Riding a stationary bike.  Strength training.  Modified yoga or Pilates. Tell your instructor that you are pregnant. Avoid overstretching, and avoid lying on your back for long periods of time.  Running or jogging. Only choose this type of exercise if you: ? Ran or jogged regularly before your pregnancy. ? Can run or jog and still talk in complete sentences. What exercises should I avoid? Depending on your level of fitness and whether you exercised regularly before your pregnancy, you may be told to limit high-intensity exercise. You can tell that you are exercising at a high intensity if you are breathing much harder and faster and  cannot hold a conversation while exercising. You must avoid:  Contact sports.  Activities that put you at risk for falling on or being hit in the belly, such as downhill skiing, water skiing, surfing, rock climbing, cycling, gymnastics, and horseback riding.  Scuba diving.  Skydiving.  Yoga or Pilates in a room that is heated to high temperatures.  Jogging or running, unless you ran or jogged regularly before your pregnancy. While jogging or running, you should always be able to talk in full sentences. Do not run or jog so fast that you are unable to have a  conversation.  Do not exercise at more than 6,000 feet above sea level (high elevation) if you are not used to exercising at high elevation. How do I exercise in a safe way?   Avoid overheating. Do not exercise in very high temperatures.  Wear loose-fitting, breathable clothes.  Avoid dehydration. Drink enough water before, during, and after exercise to keep your urine pale yellow.  Avoid overstretching. Because of hormone changes during pregnancy, it is easy to overstretch muscles, tendons, and ligaments during pregnancy.  Start slowly and ask your health care provider to recommend the types of exercise that are safe for you.  Do not exercise to lose weight. Follow these instructions at home:  Exercise on most days or all days of the week. Try to exercise for 30 minutes a day, 5 days a week, unless your health care provider tells you not to.  If you actively exercised before your pregnancy and you are healthy, your health care provider may tell you to continue to do moderate to high-intensity exercise.  If you are just starting to exercise or did not exercise much before your pregnancy, your health care provider may tell you to do low to moderate-intensity exercise. Questions to ask your health care provider  Is exercise safe for me?  What are signs that I should stop exercising?  Does my health condition mean that I should not exercise during pregnancy?  When should I avoid exercising during pregnancy? Stop exercising and contact a health care provider if: You have any unusual symptoms, such as:  Mild contractions of the uterus or cramps in the abdomen.  Dizziness that does not go away when you rest. Stop exercising and get help right away if: You have any unusual symptoms, such as:  Sudden, severe pain in your low back or your belly.  Mild contractions of the uterus or cramps in the abdomen that do not improve with rest and drinking fluids.  Chest pain.  Bleeding or  fluid leaking from your vagina.  Shortness of breath. These symptoms may represent a serious problem that is an emergency. Do not wait to see if the symptoms will go away. Get medical help right away. Call your local emergency services (911 in the [place] Do not drive yourself to the hospital. Summary  Most women should exercise regularly throughout pregnancy. In rare cases, women with certain medical conditions or complications may be asked to limit or avoid exercise during pregnancy.  Do not exercise to lose weight during pregnancy.  Your health care provider will tell you what level of physical activity is right for you.  Stop exercising and contact a health care provider if you have mild contractions of the uterus or cramps in the abdomen. Get help right away if these contractions or cramps do not improve with rest and drinking fluids.  Stop exercising and get help right away if you have sudden, severe  pain in your low back or belly, chest pain, shortness of breath, or bleeding or leaking of fluid from your vagina. This information is not intended to replace advice given to you by your health care provider. Make sure you discuss any questions you have with your health care provider. Document Revised: 10/10/2018 Document Reviewed: 07/24/2018 Elsevier Patient Education  [place].   Common Medications Safe in Pregnancy  Acne:      Constipation:  Benzoyl Peroxide     Colace  Clindamycin      Dulcolax Suppository  Topica Erythromycin     Fibercon  Salicylic Acid      Metamucil         Miralax AVOID:        Senakot   Accutane    Cough:  Retin-A       Cough Drops  Tetracycline      Phenergan w/ Codeine if Rx  Minocycline      Robitussin (Plain & DM)  Antibiotics:     Crabs/Lice:  Ceclor       RID  Cephalosporins    AVOID:  E-Mycins      Kwell  Keflex  Macrobid/Macrodantin   Diarrhea:  Penicillin      Kao-Pectate  Zithromax      Imodium AD         PUSH  FLUIDS AVOID:       Cipro     Fever:  Tetracycline      Tylenol (Regular or Extra  Minocycline       Strength)  Levaquin      Extra Strength-Do not          Exceed 8 tabs/24 hrs Caffeine:        <271m/day (equiv. To 1 cup of coffee or  approx. 3 12 oz sodas)         Gas: Cold/Hayfever:       Gas-X  Benadryl      Mylicon  Claritin       Phazyme  **Claritin-D        Chlor-Trimeton    Headaches:  Dimetapp      ASA-Free Excedrin  Drixoral-Non-Drowsy     Cold Compress  Mucinex (Guaifenasin)     Tylenol (Regular or Extra  Sudafed/Sudafed-12 Hour     Strength)  **Sudafed PE Pseudoephedrine   Tylenol Cold & Sinus     Vicks Vapor Rub  Zyrtec  **AVOID if Problems With Blood Pressure         Heartburn: Avoid lying down for at least 1 hour after meals  Aciphex      Maalox     Rash:  Milk of Magnesia     Benadryl    Mylanta       1% Hydrocortisone Cream  Pepcid  Pepcid Complete   Sleep Aids:  Prevacid      Ambien   Prilosec       Benadryl  Rolaids       Chamomile Tea  Tums (Limit 4/day)     Unisom         Tylenol PM         Warm milk-add vanilla or  Hemorrhoids:       Sugar for taste  Anusol/Anusol H.C.  (RX: Analapram 2.5%)  Sugar Substitutes:  Hydrocortisone OTC     Ok in moderation  Preparation H      Tucks        Vaseline lotion applied to tissue with wiping  Herpes:     Throat:  Acyclovir      Oragel  Famvir  Valtrex     Vaccines:         Flu Shot Leg Cramps:       *Gardasil  Benadryl      Hepatitis A         Hepatitis B Nasal Spray:       Pneumovax  Saline Nasal Spray     Polio Booster         Tetanus Nausea:       Tuberculosis test or PPD  Vitamin B6 25 mg TID   AVOID:    Dramamine      *Gardasil  Emetrol       Live Poliovirus  Ginger Root 250 mg QID    MMR (measles, mumps &  High Complex Carbs @ Bedtime    rebella)  Sea Bands-Accupressure    Varicella (Chickenpox)  Unisom 1/2 tab TID     *No known complications           If received  before Pain:         Known pregnancy;   Darvocet       Resume series after  Lortab        Delivery  Percocet    Yeast:   Tramadol      Femstat  Tylenol 3      Gyne-lotrimin  Ultram       Monistat  Vicodin           MISC:         All Sunscreens           Hair Coloring/highlights          Insect Repellant's          (Including DEET)         Mystic Tans   First Trimester of Pregnancy  The first trimester of pregnancy is from week 1 until the end of week 13 (months 1 through 3). During this time, your baby will begin to develop inside you. At 6-8 weeks, the eyes and face are formed, and the heartbeat can be seen on ultrasound. At the end of 12 weeks, all the baby's organs are formed. Prenatal care is all the medical care you receive before the birth of your baby. Make sure you get good prenatal care and follow all of your doctor's instructions. Follow these instructions at home: Medicines  Take over-the-counter and prescription medicines only as told by your doctor. Some medicines are safe and some medicines are not safe during pregnancy.  Take a prenatal vitamin that contains at least 600 micrograms (mcg) of folic acid.  If you have trouble pooping (constipation), take medicine that will make your stool soft (stool softener) if your doctor approves. Eating and drinking   Eat regular, healthy meals.  Your doctor will tell you the amount of weight gain that is right for you.  Avoid raw meat and uncooked cheese.  If you feel sick to your stomach (nauseous) or throw up (vomit): ? Eat 4 or 5 small meals a day instead of 3 large meals. ? Try eating a few soda crackers. ? Drink liquids between meals instead of during meals.  To prevent constipation: ? Eat foods that are high in fiber, like fresh fruits and vegetables, whole grains, and beans. ? Drink enough fluids to keep your pee (urine) clear or pale yellow. Activity  Exercise only as told by your doctor. Stop exercising  if you  have cramps or pain in your lower belly (abdomen) or low back.  Do not exercise if it is too hot, too humid, or if you are in a place of great height (high altitude).  Try to avoid standing for long periods of time. Move your legs often if you must stand in one place for a long time.  Avoid heavy lifting.  Wear low-heeled shoes. Sit and stand up straight.  You can have sex unless your doctor tells you not to. Relieving pain and discomfort  Wear a good support bra if your breasts are sore.  Take warm water baths (sitz baths) to soothe pain or discomfort caused by hemorrhoids. Use hemorrhoid cream if your doctor says it is okay.  Rest with your legs raised if you have leg cramps or low back pain.  If you have puffy, bulging veins (varicose veins) in your legs: ? Wear support hose or compression stockings as told by your doctor. ? Raise (elevate) your feet for 15 minutes, 3-4 times a day. ? Limit salt in your food. Prenatal care  Schedule your prenatal visits by the twelfth week of pregnancy.  Write down your questions. Take them to your prenatal visits.  Keep all your prenatal visits as told by your doctor. This is important. Safety  Wear your seat belt at all times when driving.  Make a list of emergency phone numbers. The list should include numbers for family, friends, the hospital, and police and fire departments. General instructions  Ask your doctor for a referral to a local prenatal class. Begin classes no later than at the start of month 6 of your pregnancy.  Ask for help if you need counseling or if you need help with nutrition. Your doctor can give you advice or tell you where to go for help.  Do not use hot tubs, steam rooms, or saunas.  Do not douche or use tampons or scented sanitary pads.  Do not cross your legs for long periods of time.  Avoid all herbs and alcohol. Avoid drugs that are not approved by your doctor.  Do not use any tobacco products,  including cigarettes, chewing tobacco, and electronic cigarettes. If you need help quitting, ask your doctor. You may get counseling or other support to help you quit.  Avoid cat litter boxes and soil used by cats. These carry germs that can cause birth defects in the baby and can cause a loss of your baby (miscarriage) or stillbirth.  Visit your dentist. At home, brush your teeth with a soft toothbrush. Be gentle when you floss. Contact a doctor if:  You are dizzy.  You have mild cramps or pressure in your lower belly.  You have a nagging pain in your belly area.  You continue to feel sick to your stomach, you throw up, or you have watery poop (diarrhea).  You have a bad smelling fluid coming from your vagina.  You have pain when you pee (urinate).  You have increased puffiness (swelling) in your face, hands, legs, or ankles. Get help right away if:  You have a fever.  You are leaking fluid from your vagina.  You have spotting or bleeding from your vagina.  You have very bad belly cramping or pain.  You gain or lose weight rapidly.  You throw up blood. It may look like coffee grounds.  You are around people who have [place] measles, fifth disease, or chickenpox.  You have a very bad headache.  You  have shortness of breath.  You have any kind of trauma, such as from a fall or a car accident. Summary  The first trimester of pregnancy is from week 1 until the end of week 13 (months 1 through 3).  To take care of yourself and your unborn baby, you will need to eat healthy meals, take medicines only if your doctor tells you to do so, and do activities that are safe for you and your baby.  Keep all follow-up visits as told by your doctor. This is important as your doctor will have to ensure that your baby is healthy and growing well. This information is not intended to replace advice given to you by your health care provider. Make sure you discuss any questions you have with  your health care provider. Document Revised: 10/10/2018 Document Reviewed: 06/27/2016 Elsevier Patient Education  2020 [employer].

## 2020-06-14 NOTE — Progress Notes (Signed)
Pt present for pregnancy confirmation.  

## 2020-06-30 ENCOUNTER — Other Ambulatory Visit: Payer: Managed Care, Other (non HMO)

## 2020-07-01 ENCOUNTER — Ambulatory Visit: Payer: Managed Care, Other (non HMO)

## 2020-07-01 ENCOUNTER — Ambulatory Visit (INDEPENDENT_AMBULATORY_CARE_PROVIDER_SITE_OTHER): Payer: Managed Care, Other (non HMO)

## 2020-07-01 ENCOUNTER — Other Ambulatory Visit: Payer: Self-pay | Admitting: Certified Nurse Midwife

## 2020-07-01 ENCOUNTER — Other Ambulatory Visit: Payer: Self-pay

## 2020-07-01 VITALS — BP 119/82 | HR 69 | Wt 232.4 lb

## 2020-07-01 DIAGNOSIS — Z789 Other specified health status: Secondary | ICD-10-CM

## 2020-07-01 DIAGNOSIS — Z3A01 Less than 8 weeks gestation of pregnancy: Secondary | ICD-10-CM

## 2020-07-01 NOTE — Progress Notes (Signed)
Edison Pace presents for [facility] nurse interview visit. Pregnancy confirmation done 06/14/20- Serafina Royals CNM. G-1  P- 0. Dating and viability scan done 07/01/20- 7 weeks 4 days. Pregnancy education material explained and given. 0 cats in the home. NOB labs ordered. TSH/HbgA1c due to Increased BMI. HIV labs and Drug screen were explained optional and she did not decline. Drug screen ordered. PNV encouraged. Genetic screening options discussed. Genetic testing: to be done at [facility].  Pt may discuss with provider. Pt. To follow up with provider 07/29/20 for NOB physical.  All questions answered. FMLA paperwork signed. Financial policy reviewed and understood.

## 2020-07-02 ENCOUNTER — Encounter: Payer: Self-pay | Admitting: Certified Nurse Midwife

## 2020-07-02 DIAGNOSIS — Z6730 Type AB blood, Rh positive: Secondary | ICD-10-CM | POA: Insufficient documentation

## 2020-07-02 LAB — HEPATITIS C ANTIBODY: Hep C Virus Ab: <0.1 s/co ratio (ref 0.0–0.9)

## 2020-07-02 LAB — HIV ANTIBODY (ROUTINE TESTING W REFLEX): HIV Screen 4th Generation wRfx: NONREACTIVE

## 2020-07-02 LAB — ABO AND RH: Rh Factor: POSITIVE

## 2020-07-02 LAB — HEMOGLOBIN A1C
Est. average glucose Bld gHb Est-mCnc: 114 mg/dL
Hgb A1c MFr Bld: 5.6 % (ref 4.8–5.6)

## 2020-07-02 LAB — RPR: RPR Ser Ql: NONREACTIVE

## 2020-07-02 LAB — HEPATITIS B SURFACE ANTIGEN: Hepatitis B Surface Ag: NEGATIVE

## 2020-07-02 LAB — TSH: TSH: 1.64 u[IU]/mL (ref 0.450–4.500)

## 2020-07-02 LAB — ANTIBODY SCREEN: Antibody Screen: NEGATIVE

## 2020-07-02 LAB — RUBELLA SCREEN: Rubella Antibodies, IGG: 3.46 index (ref >0.99)

## 2020-07-02 LAB — VARICELLA ZOSTER ANTIBODY, IGG: Varicella zoster IgG: 1336 index (ref >165)

## 2020-07-03 LAB — GC/CHLAMYDIA PROBE AMP
Chlamydia trachomatis, NAA: NEGATIVE
Neisseria Gonorrhoeae by PCR: NEGATIVE

## 2020-07-03 LAB — MICROSCOPIC EXAMINATION
Casts: NONE SEEN /lpf
RBC, Urine: NONE SEEN /hpf (ref 0–2)

## 2020-07-03 LAB — URINALYSIS, ROUTINE W REFLEX MICROSCOPIC
Bilirubin, UA: NEGATIVE
Glucose, UA: NEGATIVE
Ketones, UA: NEGATIVE
Nitrite, UA: NEGATIVE
Protein,UA: NEGATIVE
RBC, UA: NEGATIVE
Specific Gravity, UA: <=1.005 — AB (ref 1.005–1.030)
Urobilinogen, Ur: 0.2 mg/dL (ref 0.2–1.0)
pH, UA: 7.5 (ref 5.0–7.5)

## 2020-07-03 LAB — URINE CULTURE

## 2020-07-05 LAB — MONITOR DRUG PROFILE 14(MW)
Amphetamine Scrn, Ur: NEGATIVE ng/mL
BARBITURATE SCREEN URINE: NEGATIVE ng/mL
BENZODIAZEPINE SCREEN, URINE: NEGATIVE ng/mL
Buprenorphine, Urine: NEGATIVE ng/mL
CANNABINOIDS UR QL SCN: NEGATIVE ng/mL
Cocaine (Metab) Scrn, Ur: NEGATIVE ng/mL
Creatinine(Crt), U: 16 mg/dL — ABNORMAL LOW (ref 20.0–300.0)
Fentanyl, Urine: NEGATIVE pg/mL
Meperidine Screen, Urine: NEGATIVE ng/mL
Methadone Screen, Urine: NEGATIVE ng/mL
OXYCODONE+OXYMORPHONE UR QL SCN: NEGATIVE ng/mL
Opiate Scrn, Ur: NEGATIVE ng/mL
Ph of Urine: 6.9 (ref 4.5–8.9)
Phencyclidine Qn, Ur: NEGATIVE ng/mL
Propoxyphene Scrn, Ur: NEGATIVE ng/mL
SPECIFIC GRAVITY: 1.0025
Tramadol Screen, Urine: NEGATIVE ng/mL

## 2020-07-28 ENCOUNTER — Other Ambulatory Visit: Payer: Managed Care, Other (non HMO)

## 2020-07-28 DIAGNOSIS — Z20822 Contact with and (suspected) exposure to covid-19: Secondary | ICD-10-CM

## 2020-07-29 ENCOUNTER — Encounter: Payer: Managed Care, Other (non HMO) | Admitting: Certified Nurse Midwife

## 2020-07-29 LAB — NOVEL CORONAVIRUS, NAA: SARS-CoV-2, NAA: NOT DETECTED

## 2020-07-29 LAB — SARS-COV-2, NAA 2 DAY TAT

## 2020-07-30 ENCOUNTER — Telehealth: Payer: Self-pay

## 2020-07-30 NOTE — Telephone Encounter (Signed)
Please advise patient to schedule PCR test. Thanks, JML

## 2020-07-30 NOTE — Telephone Encounter (Signed)
Pt called in and stated that her husband tested positive for covid on 1/24 he received his results back on the 1/26. The pt got tested on the 1/26 and her test results came back negative on the 1/27. The pt is showing symptons of congestion, cough. The pt is calling in asking what she should do. I told the pt I will send a message, the pt also stated she been fully vaccinated. Please advise

## 2020-07-30 NOTE — Telephone Encounter (Signed)
LM for patient to return call.

## 2020-08-02 ENCOUNTER — Ambulatory Visit (INDEPENDENT_AMBULATORY_CARE_PROVIDER_SITE_OTHER): Payer: Managed Care, Other (non HMO) | Admitting: Certified Nurse Midwife

## 2020-08-02 ENCOUNTER — Other Ambulatory Visit: Payer: Self-pay

## 2020-08-02 VITALS — BP 149/86 | HR 83 | Wt 238.2 lb

## 2020-08-02 DIAGNOSIS — Z3A12 12 weeks gestation of pregnancy: Secondary | ICD-10-CM

## 2020-08-02 DIAGNOSIS — O99211 Obesity complicating pregnancy, first trimester: Secondary | ICD-10-CM | POA: Diagnosis not present

## 2020-08-02 DIAGNOSIS — O10011 Pre-existing essential hypertension complicating pregnancy, first trimester: Secondary | ICD-10-CM | POA: Diagnosis not present

## 2020-08-02 DIAGNOSIS — Z1379 Encounter for other screening for genetic and chromosomal anomalies: Secondary | ICD-10-CM

## 2020-08-02 DIAGNOSIS — Z6730 Type AB blood, Rh positive: Secondary | ICD-10-CM

## 2020-08-02 DIAGNOSIS — O99210 Obesity complicating pregnancy, unspecified trimester: Secondary | ICD-10-CM | POA: Insufficient documentation

## 2020-08-02 DIAGNOSIS — Z3401 Encounter for supervision of normal first pregnancy, first trimester: Secondary | ICD-10-CM

## 2020-08-02 DIAGNOSIS — E669 Obesity, unspecified: Secondary | ICD-10-CM

## 2020-08-02 DIAGNOSIS — I10 Essential (primary) hypertension: Secondary | ICD-10-CM

## 2020-08-02 LAB — POCT URINALYSIS DIPSTICK OB
Bilirubin, UA: NEGATIVE
Blood, UA: NEGATIVE
Glucose, UA: NEGATIVE
Ketones, UA: NEGATIVE
Leukocytes, UA: NEGATIVE
Nitrite, UA: NEGATIVE
POC,PROTEIN,UA: NEGATIVE
Spec Grav, UA: 1.01 (ref 1.010–1.025)
Urobilinogen, UA: 0.2 E.U./dL
pH, UA: 6.5 (ref 5.0–8.0)

## 2020-08-02 MED ORDER — ASPIRIN EC 81 MG PO TBEC
81.0000 mg | DELAYED_RELEASE_TABLET | Freq: Every day | ORAL | 2 refills | Status: DC
Start: 2020-08-02 — End: 2021-05-20

## 2020-08-02 NOTE — Progress Notes (Signed)
I have seen, interviewed, and examined the patient in conjunction with the [facility] [employer] [facility] and affirm the diagnosis and management plan.   Gunnar Bulla, CNM [facility], [facility] 08/02/20 5:12 PM

## 2020-08-02 NOTE — Progress Notes (Signed)
NEW OB HISTORY AND PHYSICAL  SUBJECTIVE:       Casey Richardson is a 30 y.o. G34P0000 female, Patient's last menstrual period was 05/03/2020., Estimated Date of Delivery: 02/13/21, [redacted]w[redacted]d, presents today for establishment of Prenatal Care.  She has no unusual complaints and complains of round ligament pain.  Denies breathing difficulty, respiratory distress, chest pain, back pain, nausea, vaginal bleeding, cramping, and leg swelling or pain.  Gynecologic History  Patient's last menstrual period was 05/03/2020.   Contraception: none, Pregnant  Last Pap: 11/2018. Results were: Normal  Obstetric History  OB History  Gravida Para Term Preterm AB Living  1 0 0 0 0 0  SAB IAB Ectopic Multiple Live Births  0 0 0 0 0    # Outcome Date GA Lbr Len/2nd Weight Sex Delivery Anes PTL Lv  1 Current             Past Medical History:  Diagnosis Date  . Hearing loss    right   . Hypertension     Past Surgical History:  Procedure Laterality Date  . APPENDECTOMY     18 years  . EAR CYST EXCISION    . nasal polyps      Current Outpatient Medications on File Prior to Visit  Medication Sig Dispense Refill  . labetalol (NORMODYNE) 100 MG tablet TAKE ONE-HALF (1/2) TABLET TWICE A DAY 90 tablet 3  . Prenatal Vit-Fe Fumarate-FA (PRENATAL VITAMINS PO) Take by mouth daily.     No current facility-administered medications on file prior to visit.    No Known Allergies  Social History   Socioeconomic History  . Marital status: Married    Spouse name: Not on file  . Number of children: Not on file  . Years of education: Not on file  . Highest education level: Not on file  Occupational History  . Not on file  Tobacco Use  . Smoking status: Never Smoker  . Smokeless tobacco: Never Used  Vaping Use  . Vaping Use: Never used  Substance and Sexual Activity  . Alcohol use: Not Currently    Comment: rare  . Drug use: Never  . Sexual activity: Yes    Birth control/protection: None   Other Topics Concern  . Not on file  Social History Narrative  . Not on file   Social Determinants of Health   Financial Resource Strain: Not on file  Food Insecurity: Not on file  Transportation Needs: Not on file  Physical Activity: Not on file  Stress: Not on file  Social Connections: Not on file  Intimate Partner Violence: Not on file    Family History  Problem Relation Age of Onset  . Hyperlipidemia Mother   . Hashimoto's thyroiditis Mother   . Hypothyroidism Mother   . Anxiety disorder Brother   . Post-traumatic stress disorder Brother   . Cancer Maternal Uncle   . Arthritis Maternal Grandmother   . Cancer Maternal Grandfather   . Cancer Paternal Grandmother   . Anemia Paternal Grandmother   . Cancer Paternal Grandfather   . Aneurysm Maternal Uncle     The following portions of the patient's history were reviewed and updated as appropriate: allergies, current medications, past OB history, past medical history, past surgical history, past family history, past social history, and problem list.  Review of Systems:  ROS- Negative other than what was reported above. Information obtained verbally from patient.  OBJECTIVE:   BP (!) 149/86   Pulse 83   Wt 238  lb 3.2 oz (108 kg)   LMP 05/03/2020   BMI 37.31 kg/m   Initial Physical Exam (New OB)  GENERAL APPEARANCE: alert, well appearing, in no apparent distress, oriented to person, place and time  LUNGS: clear to auscultation, no wheezes, rales or rhonchi, symmetric air entry  HEART: regular rate and rhythm, no murmurs  BREASTS: Exam deferred  ABDOMEN: soft, nontender, nondistended, no abnormal masses, no epigastric pain and fundus soft, nontender 12 weeks size  EXTREMITIES: no redness or tenderness in the calves or thighs, no edema  SKIN: normal coloration and turgor, no rashes  NEUROLOGIC: alert, oriented, normal speech, no focal findings or movement disorder noted  PELVIC EXAM: Exam  deferred  ASSESSMENT:  Encounter for supervision of normal first pregnancy  [redacted] weeks gestation  Genetic  Screening  Chronic hypertension on labetalol daily  Rh positive  Obesity in pregnancy  PLAN:  Rx Aspirin, see orders.   Genetic screening, see orders  Answered patients questions and reassurance provided.  Anticipatory guidance regarding course of prenatal care.  Reviewed red flag symptoms and when to call.  RTC x 4 weeks for ROB with ANNIE or sooner if needed.   Juliann Pares, Student-MidWife [employer] 08/02/20 3:47 PM

## 2020-08-02 NOTE — Patient Instructions (Signed)
Second Trimester of Pregnancy  The second trimester of pregnancy is from week 13 through week 27. This is also called months 4 through 6 of pregnancy. This is often the time when you feel your best. During the second trimester:  Morning sickness is less or has stopped.  You may have more energy.  You may feel hungry more often. At this time, your unborn baby (fetus) is growing very fast. At the end of the sixth month, the unborn baby may be up to 12 inches long and weigh about 1 pounds. You will likely start to feel the baby move between 16 and 20 weeks of pregnancy. Body changes during your second trimester Your body continues to go through many changes during this time. The changes vary and generally return to normal after the baby is born. Physical changes  You will gain more weight.  You may start to get stretch marks on your hips, belly (abdomen), and breasts.  Your breasts will grow and may hurt.  Dark spots or blotches may develop on your face.  A dark line from your belly button to the pubic area (linea nigra) may appear.  You may have changes in your hair. Health changes  You may have headaches.  You may have heartburn.  You may have trouble pooping (constipation).  You may have hemorrhoids or swollen, bulging veins (varicose veins).  Your gums may bleed.  You may pee (urinate) more often.  You may have back pain. Follow these instructions at home: Medicines  Take over-the-counter and prescription medicines only as told by your doctor. Some medicines are not safe during pregnancy.  Take a prenatal vitamin that contains at least 600 micrograms (mcg) of folic acid. Eating and drinking  Eat healthy meals that include: ? Fresh fruits and vegetables. ? Whole grains. ? Good sources of protein, such as meat, eggs, or tofu. ? Low-fat dairy products.  Avoid raw meat and unpasteurized juice, milk, and cheese.  You may need to take these actions to prevent or  treat trouble pooping: ? Drink enough fluids to keep your pee (urine) pale yellow. ? Eat foods that are high in fiber. These include beans, whole grains, and fresh fruits and vegetables. ? Limit foods that are high in fat and sugar. These include fried or sweet foods. Activity  Exercise only as told by your doctor. Most people can do their usual exercise during pregnancy. Try to exercise for 30 minutes at least 5 days a week.  Stop exercising if you have pain or cramps in your belly or lower back.  Do not exercise if it is too hot or too humid, or if you are in a place of great height (high altitude).  Avoid heavy lifting.  If you choose to, you may have sex unless your doctor tells you not to. Relieving pain and discomfort  Wear a good support bra if your breasts are sore.  Take warm water baths (sitz baths) to soothe pain or discomfort caused by hemorrhoids. Use hemorrhoid cream if your doctor approves.  Rest with your legs raised (elevated) if you have leg cramps or low back pain.  If you develop bulging veins in your legs: ? Wear support hose as told by your doctor. ? Raise your feet for 15 minutes, 3-4 times a day. ? Limit salt in your food. Safety  Wear your seat belt at all times when you are in a car.  Talk with your doctor if someone is hurting you or yelling  at you a lot. Lifestyle  Do not use hot tubs, steam rooms, or saunas.  Do not douche. Do not use tampons or scented sanitary pads.  Avoid cat litter boxes and soil used by cats. These carry germs that can harm your baby and can cause a loss of your baby by miscarriage or stillbirth.  Do not use herbal medicines, illegal drugs, or medicines that are not approved by your doctor. Do not drink alcohol.  Do not smoke or use any products that contain nicotine or tobacco. If you need help quitting, ask your doctor. General instructions  Keep all follow-up visits. This is important.  Ask your doctor about local  prenatal classes.  Ask your doctor about the right foods to eat or for help finding a counselor. Where to find more information  American Pregnancy Association: americanpregnancy.org  American College of Obstetricians and Gynecologists: www.acog.org  Office on Women's Health: womenshealth.gov/pregnancy Contact a doctor if:  You have a headache that does not go away when you take medicine.  You have changes in how you see, or you see spots in front of your eyes.  You have mild cramps, pressure, or pain in your lower belly.  You continue to feel like you may vomit (nauseous), you vomit, or you have watery poop (diarrhea).  You have bad-smelling fluid coming from your vagina.  You have pain when you pee or your pee smells bad.  You have very bad swelling of your face, hands, ankles, feet, or legs.  You have a fever. Get help right away if:  You are leaking fluid from your vagina.  You have spotting or bleeding from your vagina.  You have very bad belly cramping or pain.  You have trouble breathing.  You have chest pain.  You faint.  You have not felt your baby move for the time period told by your doctor.  You have new or increased pain, swelling, or redness in an arm or leg. Summary  The second trimester of pregnancy is from week 13 through week 27 (months 4 through 6).  Eat healthy meals.  Exercise as told by your doctor. Most people can do their usual exercise during pregnancy.  Do not use herbal medicines, illegal drugs, or medicines that are not approved by your doctor. Do not drink alcohol.  Call your doctor if you get sick or if you notice anything unusual about your pregnancy. This information is not intended to replace advice given to you by your health care provider. Make sure you discuss any questions you have with your health care provider. Document Revised: 11/26/2019 Document Reviewed: 10/02/2019 Elsevier Patient Education  2021 Elsevier  Inc.   Round Ligament Pain  The round ligament is a cord of muscle and tissue that helps support the uterus. It can become a source of pain during pregnancy if it becomes stretched or twisted as the baby grows. The pain usually begins in the second trimester (13-28 weeks) of pregnancy, and it can come and go until the baby is delivered. It is not a serious problem, and it does not cause harm to the baby. Round ligament pain is usually a short, sharp, and pinching pain, but it can also be a dull, lingering, and aching pain. The pain is felt in the lower side of the abdomen or in the groin. It usually starts deep in the groin and moves up to the outside of the hip area. The pain may occur when you:  Suddenly change position, such   as quickly going from a sitting to standing position.  Roll over in bed.  Cough or sneeze.  Do physical activity. Follow these instructions at home:  Watch your condition for any changes.  When the pain starts, relax. Then try any of these methods to help with the pain: ? Sitting down. ? Flexing your knees up to your abdomen. ? Lying on your side with one pillow under your abdomen and another pillow between your legs. ? Sitting in a warm bath for 15-20 minutes or until the pain goes away.  Take over-the-counter and prescription medicines only as told by your health care provider.  Move slowly when you sit down or stand up.  Avoid long walks if they cause pain.  Stop or reduce your physical activities if they cause pain.  Keep all follow-up visits as told by your health care provider. This is important.   Contact a health care provider if:  Your pain does not go away with treatment.  You feel pain in your back that you did not have before.  Your medicine is not helping. Get help right away if:  You have a fever or chills.  You develop uterine contractions.  You have vaginal bleeding.  You have nausea or vomiting.  You have diarrhea.  You  have pain when you urinate. Summary  Round ligament pain is felt in the lower abdomen or groin. It is usually a short, sharp, and pinching pain. It can also be a dull, lingering, and aching pain.  This pain usually begins in the second trimester (13-28 weeks). It occurs because the uterus is stretching with the growing baby, and it is not harmful to the baby.  You may notice the pain when you suddenly change position, when you cough or sneeze, or during physical activity.  Relaxing, flexing your knees to your abdomen, lying on one side, or taking a warm bath may help to get rid of the pain.  Get help from your health care provider if the pain does not go away or if you have vaginal bleeding, nausea, vomiting, diarrhea, or painful urination. This information is not intended to replace advice given to you by your health care provider. Make sure you discuss any questions you have with your health care provider. Document Revised: 12/05/2017 Document Reviewed: 12/05/2017 Elsevier Patient Education  2021 Elsevier Inc.  

## 2020-08-02 NOTE — Progress Notes (Signed)
OB-PT for PE. Pt stated having pelvic pain and would like to get [facility] for genetic testing.

## 2020-08-03 LAB — CBC
Hematocrit: 38.3 % (ref 34.0–46.6)
Hemoglobin: 12.5 g/dL (ref 11.1–15.9)
MCH: 28 pg (ref 26.6–33.0)
MCHC: 32.6 g/dL (ref 31.5–35.7)
MCV: 86 fL (ref 79–97)
Platelets: 262 10*3/uL (ref 150–450)
RBC: 4.47 x10E6/uL (ref 3.77–5.28)
RDW: 13.7 % (ref 11.7–15.4)
WBC: 8.7 10*3/uL (ref 3.4–10.8)

## 2020-08-17 ENCOUNTER — Telehealth: Payer: Self-pay

## 2020-08-17 NOTE — Telephone Encounter (Signed)
New message    Patient does not want to know the gender of the baby, results of other labs

## 2020-08-17 NOTE — Telephone Encounter (Signed)
Pt would like her gender results in a envelope left up front.   Ask FH to see if results were back.

## 2020-08-19 ENCOUNTER — Telehealth: Payer: Self-pay | Admitting: Certified Nurse Midwife

## 2020-08-19 NOTE — Telephone Encounter (Signed)
Please check status of results. Thanks, JML

## 2020-08-19 NOTE — Telephone Encounter (Signed)
Yes ma'ma I checked and there is still no results.

## 2020-08-19 NOTE — Telephone Encounter (Signed)
Pt was returning called from the nurse. The nurse sent a my chart message letting the pt know we having get the results. The pt being upset with me and stated that she has a party this weekend for the gender and that she wants to know what she can do. She researched out to [place] and they said they got the test. I told the pt I was sorry and ill send a message to my Engineer, manufacturing. The pt said she isn't upset with [place]. I told her I understand her  Frustration. The pt would like a call back. Please advise

## 2020-08-19 NOTE — Telephone Encounter (Signed)
Patient called asking about lab results (genetic) pt is concerned since blood was drawn over 2 weeks ago (1-31), pt states she is wanting to know if labs are normal but does not want to know gender- she requested gender to be picked up for a gender reveal that has already been planned for this weekend- pt asking if she needs to push back reveal. Please advise.

## 2020-08-20 ENCOUNTER — Other Ambulatory Visit: Payer: Self-pay

## 2020-08-20 ENCOUNTER — Encounter: Payer: Self-pay | Admitting: Certified Nurse Midwife

## 2020-08-20 ENCOUNTER — Ambulatory Visit (INDEPENDENT_AMBULATORY_CARE_PROVIDER_SITE_OTHER): Payer: Managed Care, Other (non HMO) | Admitting: Certified Nurse Midwife

## 2020-08-20 ENCOUNTER — Telehealth: Payer: Self-pay | Admitting: Certified Nurse Midwife

## 2020-08-20 VITALS — BP 151/94 | HR 102 | Ht 67.0 in | Wt 239.7 lb

## 2020-08-20 DIAGNOSIS — Z3A14 14 weeks gestation of pregnancy: Secondary | ICD-10-CM

## 2020-08-20 DIAGNOSIS — O285 Abnormal chromosomal and genetic finding on antenatal screening of mother: Secondary | ICD-10-CM

## 2020-08-20 DIAGNOSIS — Z3402 Encounter for supervision of normal first pregnancy, second trimester: Secondary | ICD-10-CM

## 2020-08-20 DIAGNOSIS — I10 Essential (primary) hypertension: Secondary | ICD-10-CM

## 2020-08-20 NOTE — Progress Notes (Signed)
Pt present to discuss [facility] genetic testing results.

## 2020-08-20 NOTE — Progress Notes (Signed)
Subjective:   Casey Richardson is a 30 y.o. G1P0000 [redacted]w[redacted]d being seen today for conference visit to discuss [facility] (cell free DNA) results.   She is accompanied by her spouse.   Denies difficulty breathing or respiratory distress, chest pain, abdominal pain, vaginal bleeding, dysuria, and leg pain or swelling.   The following portions of the patient's history were reviewed and updated as appropriate: allergies, current medications, past family history, past medical history, past social history, past surgical history and problem list.   Review of Systems:  ROS negative except as noted above. Information obtained from patient.   Objective:   BP (!) 151/94   Pulse (!) 102   Ht 5\' 7"  (1.702 m)   Wt 239 lb 11.2 oz (108.7 kg)   LMP 05/03/2020   BMI 37.54 kg/m   FHT: 148 bpm  Fetal Movement: Seen on bedside ultrasound   Assessment:   Pregnancy:  G1P0000 at [redacted]w[redacted]d  1. Abnormal chromosomal and genetic finding on antenatal screening mother   2. Encounter for supervision of normal first pregnancy in second trimester   3. [redacted] weeks gestation of pregnancy   Plan:   Genetic counselor from [place] called prior to visit with patient for further information. 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   Information given to patient along with copy of report.   Recommend referral to MFM for genetic counseling, detailed ultrasound, and amniocentesis (if desired); patient and spouse agree to plan, see orders.   Home blood pressure log discussed with patient, notes pressures 120-130/70-80.   Reviewed red flag symptoms and when to call.   RTC as previously scheduled or sooner if needed.    [place], CNM [facility], [facility]   I spend 20 minutes in the care of this patient on the date of this encounter including pre-visit review of records, face to face discussion with patient and  spouse, consultation, and placement of orders.

## 2020-08-20 NOTE — Telephone Encounter (Signed)
Telephone call to patient, verified full name and date of birth.   Advised genetic results were available, CNM wishes to discuss in person with patient and spouse. Appointment made for this afternoon.    Serafina Royals, CNM [facility], [facility] 08/20/20 1:58 PM

## 2020-08-20 NOTE — Patient Instructions (Signed)
Second Trimester of Pregnancy  The second trimester of pregnancy is from week 13 through week 27. This is also called months 4 through 6 of pregnancy. This is often the time when you feel your best. During the second trimester:  Morning sickness is less or has stopped.  You may have more energy.  You may feel hungry more often. At this time, your unborn baby (fetus) is growing very fast. At the end of the sixth month, the unborn baby may be up to 12 inches long and weigh about 1 pounds. You will likely start to feel the baby move between 16 and 20 weeks of pregnancy. Body changes during your second trimester Your body continues to go through many changes during this time. The changes vary and generally return to normal after the baby is born. Physical changes  You will gain more weight.  You may start to get stretch marks on your hips, belly (abdomen), and breasts.  Your breasts will grow and may hurt.  Dark spots or blotches may develop on your face.  A dark line from your belly button to the pubic area (linea nigra) may appear.  You may have changes in your hair. Health changes  You may have headaches.  You may have heartburn.  You may have trouble pooping (constipation).  You may have hemorrhoids or swollen, bulging veins (varicose veins).  Your gums may bleed.  You may pee (urinate) more often.  You may have back pain. Follow these instructions at home: Medicines  Take over-the-counter and prescription medicines only as told by your doctor. Some medicines are not safe during pregnancy.  Take a prenatal vitamin that contains at least 600 micrograms (mcg) of folic acid. Eating and drinking  Eat healthy meals that include: ? Fresh fruits and vegetables. ? Whole grains. ? Good sources of protein, such as meat, eggs, or tofu. ? Low-fat dairy products.  Avoid raw meat and unpasteurized juice, milk, and cheese.  You may need to take these actions to prevent or  treat trouble pooping: ? Drink enough fluids to keep your pee (urine) pale yellow. ? Eat foods that are high in fiber. These include beans, whole grains, and fresh fruits and vegetables. ? Limit foods that are high in fat and sugar. These include fried or sweet foods. Activity  Exercise only as told by your doctor. Most people can do their usual exercise during pregnancy. Try to exercise for 30 minutes at least 5 days a week.  Stop exercising if you have pain or cramps in your belly or lower back.  Do not exercise if it is too hot or too humid, or if you are in a place of great height (high altitude).  Avoid heavy lifting.  If you choose to, you may have sex unless your doctor tells you not to. Relieving pain and discomfort  Wear a good support bra if your breasts are sore.  Take warm water baths (sitz baths) to soothe pain or discomfort caused by hemorrhoids. Use hemorrhoid cream if your doctor approves.  Rest with your legs raised (elevated) if you have leg cramps or low back pain.  If you develop bulging veins in your legs: ? Wear support hose as told by your doctor. ? Raise your feet for 15 minutes, 3-4 times a day. ? Limit salt in your food. Safety  Wear your seat belt at all times when you are in a car.  Talk with your doctor if someone is hurting you or yelling  at you a lot. Lifestyle  Do not use hot tubs, steam rooms, or saunas.  Do not douche. Do not use tampons or scented sanitary pads.  Avoid cat litter boxes and soil used by cats. These carry germs that can harm your baby and can cause a loss of your baby by miscarriage or stillbirth.  Do not use herbal medicines, illegal drugs, or medicines that are not approved by your doctor. Do not drink alcohol.  Do not smoke or use any products that contain nicotine or tobacco. If you need help quitting, ask your doctor. General instructions  Keep all follow-up visits. This is important.  Ask your doctor about local  prenatal classes.  Ask your doctor about the right foods to eat or for help finding a counselor. Where to find more information  American Pregnancy Association: americanpregnancy.org  [employer]: www.acog.org  Office on [employer] [facility]: [employer] Contact a doctor if:  You have a headache that does not go away when you take medicine.  You have changes in how you see, or you see spots in front of your eyes.  You have mild cramps, pressure, or pain in your lower belly.  You continue to feel like you may vomit (nauseous), you vomit, or you have watery poop (diarrhea).  You have bad-smelling fluid coming from your vagina.  You have pain when you pee or your pee smells bad.  You have very bad swelling of your face, hands, ankles, feet, or legs.  You have a fever. Get help right away if:  You are leaking fluid from your vagina.  You have spotting or bleeding from your vagina.  You have very bad belly cramping or pain.  You have trouble breathing.  You have chest pain.  You faint.  You have not felt your baby move for the time period told by your doctor.  You have new or increased pain, swelling, or redness in an arm or leg. Summary  The second trimester of pregnancy is from week 13 through week 27 (months 4 through 6).  Eat healthy meals.  Exercise as told by your doctor. Most people can do their usual exercise during pregnancy.  Do not use herbal medicines, illegal drugs, or medicines that are not approved by your doctor. Do not drink alcohol.  Call your doctor if you get sick or if you notice anything unusual about your pregnancy. This information is not intended to replace advice given to you by your health care provider. Make sure you discuss any questions you have with your health care provider. Document Revised: 11/26/2019 Document Reviewed: 10/02/2019 Elsevier Patient Education  2021 Elsevier  Inc.   Amniocentesis Amniocentesis is a procedure to remove a small amount of the fluid that surrounds a baby in the uterus (amniotic fluid) so that it can be tested. Testing this fluid can provide important information about the baby. Amniocentesis is most commonly done to check whether the baby has certain genetic problems. It is usually performed between 15-20 weeks of pregnancy. Amniocentesis may also be done later in pregnancy if there are concerns about your baby's lung maturity or an infection. Tell a health care provider about:  Any complications you have had with your pregnancy, such as bleeding or contractions.  Any allergies you have.  All medicines you are taking, including vitamins, herbs, eye drops, creams, and over-the-counter medicines.  Any problems you or family members have had with anesthetic medicines.  Any blood disorders you have.  Any  surgeries you have had.  Any medical conditions you have.  Your blood type, including if it is Rh negative. What are the risks? Generally, this is a safe procedure. However, problems may occur, including:  Bleeding.  Leaking of amniotic fluid.  Early (premature) labor.  Injury to the fetus.  Injury to the placenta.  Infection.  Loss of pregnancy (miscarriage). This is rare. What happens before the procedure? General instructions  You may be asked to drink fluid 2 hours before the exam and avoid emptying your bladder. A full bladder helps ultrasound images show up more clearly.  Ask your health care provider about: ? Changing or stopping your regular medicines. This is especially important if you are taking diabetes medicines or blood thinners. ? Taking medicines such as aspirin and ibuprofen. These medicines can thin your blood. Do not take these medicines unless your health care provider tells you to take them. ? Taking over-the-counter medicines, vitamins, herbs, and supplements.  Plan to have someone take you  home from the hospital or clinic. What happens during the procedure?  You will undress and put on a patient gown.  You will lie down on an exam table.  An ultrasound will be done to determine the baby's position and the best place to remove the fluid sample.  A solution to help prevent infection will be applied to your abdomen. Do not touch the area where the solution was applied until after the procedure is over.  You may be given a medicine to numb the area (local anesthetic). It is normal to feel some cramping while the procedure is being performed, even after getting this medicine.  A thin needle will be inserted through the skin and into the uterus.  The needle will be used to remove a small amount of amniotic fluid from the amniotic sac. This is usually less than 2 tablespoons (30 mL).  The needle will be removed.  The part of the skin where the needle was inserted will be covered with a bandage.  The fluid that was removed will be sent to the lab for testing. The procedure may vary among health care providers and hospitals.   What can I expect after procedure?  You may be asked to stay and rest after the procedure for 1 to 2 hours. During this time, you and your baby will be monitored by your health care team to make sure that there are no problems before you go home.  It is common to have some cramping in the abdomen.  If you are Rh negative, you may be given a Rho (D) immune globulin shot. This medicine helps to prevent problems that can be caused by a protein on red blood cells (Rh factor). Ask your health care provider if you need to have this shot. Follow these instructions at home: Activity  Rest for the first 24 hours after your procedure.  Do not lift anything heavy or engage in activities that require great effort during the first 24 hours after your procedure, as told by your health care provider.  Follow your health care provider's instructions about when you  can return to your normal activities, such as going back to work, doing exercise, or having sex. Do not have sex until your health care provider says it is okay. General instructions  Take over-the-counter and prescription medicines only as told by your health care provider.  Drink enough fluid to keep your urine pale yellow. This will help relieve cramping.  Keep  all follow-up visits. This is important. Questions to ask your doctor  Ask your health care provider, or the department that is doing the test: ? When will my results be ready? ? How will I get my results? ? What other tests do I need? ? What are my next steps? Contact a health care provider if:  You have pain in the abdomen. Get help right away if:  You have severe pain or cramping in the abdomen.  You have bleeding from your vagina or from the needle insertion site in your abdomen.  Fluid leaks from your vagina or from the needle insertion site on your abdomen.  You have a fever or feel as though you have a fever.  You do not feel the baby moving as much as you normally do. Summary  Amniocentesis is a procedure to remove a small amount of the fluid that surrounds a baby in the uterus (amniotic fluid) so that it can be tested. This can provide important information about the baby.  Generally, amniocentesis is a safe procedure. However, problems may occur, including bleeding, leaking of amniotic fluid, early labor, and injury to the fetus.  Plan to have someone take you home from the hospital or clinic.  Rest for the first 24 hours after the procedure. Follow other activity restrictions as told by your health care provider. This information is not intended to replace advice given to you by your health care provider. Make sure you discuss any questions you have with your health care provider. Document Revised: 10/10/2019 Document Reviewed: 10/10/2019 Elsevier Patient Education  2021 [employer].

## 2020-08-23 ENCOUNTER — Other Ambulatory Visit: Payer: Self-pay | Admitting: Certified Nurse Midwife

## 2020-08-23 DIAGNOSIS — Z3689 Encounter for other specified antenatal screening: Secondary | ICD-10-CM

## 2020-08-24 ENCOUNTER — Other Ambulatory Visit: Payer: Self-pay | Admitting: Certified Nurse Midwife

## 2020-08-24 DIAGNOSIS — O285 Abnormal chromosomal and genetic finding on antenatal screening of mother: Secondary | ICD-10-CM

## 2020-08-24 DIAGNOSIS — O99212 Obesity complicating pregnancy, second trimester: Secondary | ICD-10-CM

## 2020-08-26 ENCOUNTER — Ambulatory Visit: Payer: Managed Care, Other (non HMO) | Attending: Maternal & Fetal Medicine

## 2020-08-26 ENCOUNTER — Other Ambulatory Visit: Payer: Self-pay

## 2020-08-26 ENCOUNTER — Ambulatory Visit (HOSPITAL_BASED_OUTPATIENT_CLINIC_OR_DEPARTMENT_OTHER): Payer: Managed Care, Other (non HMO)

## 2020-08-26 DIAGNOSIS — O281 Abnormal biochemical finding on antenatal screening of mother: Secondary | ICD-10-CM | POA: Diagnosis not present

## 2020-08-26 DIAGNOSIS — O321XX0 Maternal care for breech presentation, not applicable or unspecified: Secondary | ICD-10-CM

## 2020-08-26 DIAGNOSIS — Q909 Down syndrome, unspecified: Secondary | ICD-10-CM | POA: Insufficient documentation

## 2020-08-26 DIAGNOSIS — Z3A15 15 weeks gestation of pregnancy: Secondary | ICD-10-CM

## 2020-08-26 DIAGNOSIS — E669 Obesity, unspecified: Secondary | ICD-10-CM | POA: Diagnosis not present

## 2020-08-26 DIAGNOSIS — O99212 Obesity complicating pregnancy, second trimester: Secondary | ICD-10-CM | POA: Diagnosis not present

## 2020-08-26 DIAGNOSIS — Z3A00 Weeks of gestation of pregnancy not specified: Secondary | ICD-10-CM

## 2020-08-26 DIAGNOSIS — O285 Abnormal chromosomal and genetic finding on antenatal screening of mother: Secondary | ICD-10-CM | POA: Diagnosis not present

## 2020-08-26 DIAGNOSIS — Z3689 Encounter for other specified antenatal screening: Secondary | ICD-10-CM | POA: Diagnosis not present

## 2020-08-26 NOTE — Progress Notes (Signed)
Referring provider:  [facility] Length of Consultation:  30 minutes  Ms. Spink was referred to Maternal Fetal Care at [facility] for genetic counseling and an ultrasound due to the results of her Panorama screening.  CaseyRichardson had Panorama noninvasive prenatal screening (NIPS) through [facility], ordered by her Ob/Gyn, that demonstrated a "suspected atypical finding outside the scope of the test". 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 Imm herself. 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  To better understand these results, we reviewed that NIPS analyzes cell-free DNA from the placenta found in the maternal bloodstream during pregnancy. Based on this, we discussed that there are several possibilities that could warrant her atypical NIPS result, including a normal pregnancy, confined placental mosaicism (a result representative of the placenta only rather than the fetus) for a chromosomal abnormality, complete or mosaic trisomy 21 in the fetus, or the presence of another chromosome abnormality that could not be assessed by this screen.   Since the laboratory suspects that there is an abnormality of chromosome 21 of fetal (placental) origin, we discussed possible fetal abnormalities that would not be detectable by NIPS but, could lead to an atypical result. These include deletions or duplications of chromosome 21 material, a chromosomal rearrangement involving chromosome 21 and possibly another chromosome, or trisomy 21 mosaicism (some cells in the body having trisomy 21, with other cells in the body being chromosomally normal). We discussed that it can be difficult to predict which features an individual with one of these conditions may have during the prenatal period. Some deletions, duplications, and rearrangements are benign, whereas others may be associated with  problems with health or development. For trisomy 37 mosaicism specifically, individuals may display widely variable clinical features. It is impossible to predict the percentage of cells with trisomy 21 that are present in some organs, such as the brain or heart, kidneys which makes prediction of features challenging. Some individuals with trisomy 21 mosaicism have similar features to those with full trisomy 26 (Down syndrome). Other individuals with trisomy 23 mosaicism may have very mild features or be phenotypically normal. In general, individuals with mosaicism who have a high frequency of trisomy 21 cells tend to have more clinical traits associated with Down syndrome than individuals with mosaicism who have lower proportions of trisomic cells; however, this relationship has not been universally observed for all traits (Papavassiliou et al., 2014).    We also discussed possible explanations for Ms. Hudson NIPS result that do not relate to the fetus. Since NIPS is assessing placental rather than fetal cells, it is possible that the placenta may have an abnormality involving chromosome 21 while the fetus may be chromosomally normal. This is a phenomenon known as confined placental mosaicism.    To provide additional information on the health of this pregnancy, we discussed the following:  Detailed fetal anatomy ultrasound.  This is scheduled at [redacted] weeks gestation and will allow a more complete view of the fetal growth and anatomy.  The ultrasound today was at 15 weeks, 4 days and was normal,  However, at this early gestational age, imaging of fetal anatomy including the heart, spine and other structures are limited.  She is scheduled to return on 09/21/20 at 1pm. It is important to remember that not all birth defects or chromosome conditions can be detected by ultrasound.  Amniocentesis involves the removal of a small amount of amniotic  fluid from the sac surrounding the fetus with the use of a thin  needle inserted through the maternal abdomen and uterus.  Ultrasound guidance is used throughout the procedure.  Fetal cells from amniotic fluid are directly evaluated and > 99.5% of chromosome problems and > 98% of open neural tube defects can be detected. This procedure is generally performed after the 15th week of pregnancy.  The main risks to this procedure include complications leading to miscarriage in less than 1 in 200 cases (0.5%). This testing option is the most accurate way to detect chromosome conditions in pregnancy.  Fetal echocardiogram is an ultrasound performed after [redacted] weeks gestation to evaluate fetal cardiac structures.  This could be considered, as approximately 50% of babies with trisomy 21 have a structural heart defect.  Lastly, we reviewed the option of carrier screening for Cystic Fibrosis and Spinal Muscular Atrophy (SMA). Both conditions are recessive, which means that both parents must be carriers in order to have a child with the disease.  Cystic fibrosis (CF) is one of the most common genetic conditions in persons of Caucasian ancestry.  This condition occurs in approximately 1 in 2,500 Caucasian persons and results in thickened secretions in the lungs, digestive, and reproductive systems.  For a baby to be at risk for having CF, both of the parents must be carriers for this condition.  Approximately 1 in 31 Caucasian persons is a carrier for CF.  Current carrier testing looks for the most common mutations in the gene for CF and can detect approximately 90% of carriers in the Caucasian population.  This means that the carrier screening can greatly reduce, but cannot eliminate, the chance for an individual to have a child with CF.  If an individual is found to be a carrier for CF, then carrier testing would be available for the partner. As part of [place]'s newborn screening profile, all babies born in the state of [place] will have a two-tier screening process.   Specimens are first tested to determine the concentration of immunoreactive trypsinogen (IRT).  The top 5% of specimens with the highest IRT values then undergo DNA testing using a panel of over 40 common CF mutations. SMA is a neurodegenerative disorder that leads to atrophy of skeletal muscle and overall weakness.  This condition is also more prevalent in the Caucasian population, with 1 in 40-1 in 60 persons being a carrier and 1 in 6,000-1 in 10,000 children being affected.  There are multiple forms of the disease, with some causing death in infancy to other forms with survival into adulthood.  The genetics of SMA is complex, but carrier screening can detect up to 95% of carriers in the Caucasian population.  Similar to CF, a negative result can greatly reduce, but cannot eliminate, the chance to have a child with SMA.  Hemoglobinopathy screening was also offered to the patient.  We also inquired about the family history and pregnancy.  The couple stated that this is their first pregnancy.  She reported no complications or exposures in this pregnancy to alcohol, tobacco or recreational drugs.  She is taking Labetalol and a low dose aspirin as directed by her OB.  In the family history, there were no individuals with birth defects, intellectual disabilities, recurrent pregnancy loss, chromosome conditions or known genetic disorders.  Plan of Care:  The couple declines amniocentesis at this time, as results would not alter their management of the pregnancy.  Return to [facility] 09/21/20 for detailed anatomy  ultrasound.  Consider fetal echo based upon results of 09/21/20 ultrsaound.  Declined carrier screening for CF, SMA and hemoglobinopathies.   We may be reached at [phone] with any questions or concerns.   Cherly Anderson, MS, CGC

## 2020-08-30 ENCOUNTER — Ambulatory Visit (INDEPENDENT_AMBULATORY_CARE_PROVIDER_SITE_OTHER): Payer: Managed Care, Other (non HMO) | Admitting: Certified Nurse Midwife

## 2020-08-30 ENCOUNTER — Other Ambulatory Visit: Payer: Self-pay

## 2020-08-30 VITALS — BP 142/87 | HR 76 | Wt 240.3 lb

## 2020-08-30 DIAGNOSIS — O10012 Pre-existing essential hypertension complicating pregnancy, second trimester: Secondary | ICD-10-CM | POA: Diagnosis not present

## 2020-08-30 DIAGNOSIS — Z3A16 16 weeks gestation of pregnancy: Secondary | ICD-10-CM | POA: Diagnosis not present

## 2020-08-30 DIAGNOSIS — O10919 Unspecified pre-existing hypertension complicating pregnancy, unspecified trimester: Secondary | ICD-10-CM

## 2020-08-30 NOTE — Patient Instructions (Signed)

## 2020-08-30 NOTE — Progress Notes (Signed)
Rob doing well. State she is feeling some small movements. Discussed baseline labs today. Reviewed musculoskeletal discomforts and self help measures. Discussed weight gain recommendations and exercise in pregnancy. She verbalizes and agrees . Follow up as scheduled for anatomy scan with MFM 3/22. ROB with Marcelino Duster in 4 wks.   Doreene Burke, CNM

## 2020-08-31 LAB — COMPREHENSIVE METABOLIC PANEL
ALT: 25 IU/L (ref 0–32)
AST: 21 IU/L (ref 0–40)
Albumin/Globulin Ratio: 1.5 (ref 1.2–2.2)
Albumin: 4 g/dL (ref 3.8–4.8)
Alkaline Phosphatase: 61 IU/L (ref 44–121)
BUN/Creatinine Ratio: 13 (ref 9–23)
BUN: 6 mg/dL (ref 6–20)
Bilirubin Total: <0.2 mg/dL (ref 0.0–1.2)
CO2: 20 mmol/L (ref 20–29)
Calcium: 9.7 mg/dL (ref 8.7–10.2)
Chloride: 103 mmol/L (ref 96–106)
Creatinine, Ser: 0.48 mg/dL — ABNORMAL LOW (ref 0.57–1.00)
Globulin, Total: 2.6 g/dL (ref 1.5–4.5)
Glucose: 80 mg/dL (ref 65–99)
Potassium: 4.3 mmol/L (ref 3.5–5.2)
Sodium: 138 mmol/L (ref 134–144)
Total Protein: 6.6 g/dL (ref 6.0–8.5)
eGFR: 130 mL/min/{1.73_m2} (ref >59)

## 2020-08-31 LAB — PROTEIN / CREATININE RATIO, URINE
Creatinine, Urine: 35.3 mg/dL
Protein, Ur: 4.9 mg/dL
Protein/Creat Ratio: 139 mg/g creat (ref 0–200)

## 2020-09-15 ENCOUNTER — Ambulatory Visit: Payer: Managed Care, Other (non HMO)

## 2020-09-21 ENCOUNTER — Other Ambulatory Visit: Payer: Self-pay

## 2020-09-21 ENCOUNTER — Ambulatory Visit: Payer: Managed Care, Other (non HMO) | Attending: Maternal & Fetal Medicine

## 2020-09-21 DIAGNOSIS — O321XX0 Maternal care for breech presentation, not applicable or unspecified: Secondary | ICD-10-CM

## 2020-09-21 DIAGNOSIS — Z3689 Encounter for other specified antenatal screening: Secondary | ICD-10-CM | POA: Insufficient documentation

## 2020-09-21 DIAGNOSIS — O99212 Obesity complicating pregnancy, second trimester: Secondary | ICD-10-CM | POA: Diagnosis not present

## 2020-09-21 DIAGNOSIS — O289 Unspecified abnormal findings on antenatal screening of mother: Secondary | ICD-10-CM | POA: Diagnosis not present

## 2020-09-21 DIAGNOSIS — Z3A19 19 weeks gestation of pregnancy: Secondary | ICD-10-CM | POA: Insufficient documentation

## 2020-09-21 DIAGNOSIS — O358XX0 Maternal care for other (suspected) fetal abnormality and damage, not applicable or unspecified: Secondary | ICD-10-CM

## 2020-09-23 ENCOUNTER — Encounter: Payer: Self-pay | Admitting: Certified Nurse Midwife

## 2020-09-23 DIAGNOSIS — O43199 Other malformation of placenta, unspecified trimester: Secondary | ICD-10-CM | POA: Insufficient documentation

## 2020-09-23 DIAGNOSIS — O4442 Low lying placenta NOS or without hemorrhage, second trimester: Secondary | ICD-10-CM | POA: Insufficient documentation

## 2020-09-23 DIAGNOSIS — Q212 Atrioventricular septal defect, unspecified as to partial or complete: Secondary | ICD-10-CM | POA: Insufficient documentation

## 2020-09-27 ENCOUNTER — Encounter: Payer: Self-pay | Admitting: Certified Nurse Midwife

## 2020-09-27 ENCOUNTER — Other Ambulatory Visit: Payer: Self-pay

## 2020-09-27 ENCOUNTER — Ambulatory Visit (INDEPENDENT_AMBULATORY_CARE_PROVIDER_SITE_OTHER): Payer: Managed Care, Other (non HMO) | Admitting: Certified Nurse Midwife

## 2020-09-27 VITALS — BP 155/81 | HR 80 | Wt 239.6 lb

## 2020-09-27 DIAGNOSIS — Z3A20 20 weeks gestation of pregnancy: Secondary | ICD-10-CM

## 2020-09-27 DIAGNOSIS — O99210 Obesity complicating pregnancy, unspecified trimester: Secondary | ICD-10-CM

## 2020-09-27 DIAGNOSIS — O10012 Pre-existing essential hypertension complicating pregnancy, second trimester: Secondary | ICD-10-CM | POA: Diagnosis not present

## 2020-09-27 DIAGNOSIS — O99212 Obesity complicating pregnancy, second trimester: Secondary | ICD-10-CM | POA: Diagnosis not present

## 2020-09-27 DIAGNOSIS — Q212 Atrioventricular septal defect, unspecified as to partial or complete: Secondary | ICD-10-CM

## 2020-09-27 DIAGNOSIS — O285 Abnormal chromosomal and genetic finding on antenatal screening of mother: Secondary | ICD-10-CM | POA: Insufficient documentation

## 2020-09-27 DIAGNOSIS — O10919 Unspecified pre-existing hypertension complicating pregnancy, unspecified trimester: Secondary | ICD-10-CM | POA: Insufficient documentation

## 2020-09-27 DIAGNOSIS — Z3402 Encounter for supervision of normal first pregnancy, second trimester: Secondary | ICD-10-CM

## 2020-09-27 DIAGNOSIS — E669 Obesity, unspecified: Secondary | ICD-10-CM

## 2020-09-27 LAB — POCT URINALYSIS DIPSTICK OB
Bilirubin, UA: NEGATIVE
Blood, UA: NEGATIVE
Glucose, UA: NEGATIVE
Ketones, UA: NEGATIVE
Leukocytes, UA: NEGATIVE
Nitrite, UA: NEGATIVE
POC,PROTEIN,UA: NEGATIVE
Spec Grav, UA: <=1.005 — AB (ref 1.010–1.025)
Urobilinogen, UA: 0.2 E.U./dL
pH, UA: 6 (ref 5.0–8.0)

## 2020-09-27 NOTE — Patient Instructions (Signed)
Common Medications Safe in Pregnancy  Acne:      Constipation:  Benzoyl Peroxide     Colace  Clindamycin      Dulcolax Suppository  Topica Erythromycin     Fibercon  Salicylic Acid      Metamucil         Miralax AVOID:        Senakot   Accutane    Cough:  Retin-A       Cough Drops  Tetracycline      Phenergan w/ Codeine if Rx  Minocycline      Robitussin (Plain & DM)  Antibiotics:     Crabs/Lice:  Ceclor       RID  Cephalosporins    AVOID:  E-Mycins      Kwell  Keflex  Macrobid/Macrodantin   Diarrhea:  Penicillin      Kao-Pectate  Zithromax      Imodium AD         PUSH FLUIDS AVOID:       Cipro     Fever:  Tetracycline      Tylenol (Regular or Extra  Minocycline       Strength)  Levaquin      Extra Strength-Do not          Exceed 8 tabs/24 hrs Caffeine:        <200mg/day (equiv. To 1 cup of coffee or  approx. 3 12 oz sodas)         Gas: Cold/Hayfever:       Gas-X  Benadryl      Mylicon  Claritin       Phazyme  **Claritin-D        Chlor-Trimeton    Headaches:  Dimetapp      ASA-Free Excedrin  Drixoral-Non-Drowsy     Cold Compress  Mucinex (Guaifenasin)     Tylenol (Regular or Extra  Sudafed/Sudafed-12 Hour     Strength)  **Sudafed PE Pseudoephedrine   Tylenol Cold & Sinus     Vicks Vapor Rub  Zyrtec  **AVOID if Problems With Blood Pressure         Heartburn: Avoid lying down for at least 1 hour after meals  Aciphex      Maalox     Rash:  Milk of Magnesia     Benadryl    Mylanta       1% Hydrocortisone Cream  Pepcid  Pepcid Complete   Sleep Aids:  Prevacid      Ambien   Prilosec       Benadryl  Rolaids       Chamomile Tea  Tums (Limit 4/day)     Unisom         Tylenol PM         Warm milk-add vanilla or  Hemorrhoids:       Sugar for taste  Anusol/Anusol H.C.  (RX: Analapram 2.5%)  Sugar Substitutes:  Hydrocortisone OTC     Ok in moderation  Preparation H      Tucks        Vaseline lotion applied to tissue with  wiping    Herpes:     Throat:  Acyclovir      Oragel  Famvir  Valtrex     Vaccines:         Flu Shot Leg Cramps:       *Gardasil  Benadryl      Hepatitis A         Hepatitis B Nasal Spray:         Pneumovax  Saline Nasal Spray     Polio Booster         Tetanus Nausea:       Tuberculosis test or PPD  Vitamin B6 25 mg TID   AVOID:    Dramamine      *Gardasil  Emetrol       Live Poliovirus  Ginger Root 250 mg QID    MMR (measles, mumps &  High Complex Carbs @ Bedtime    rebella)  Sea Bands-Accupressure    Varicella (Chickenpox)  Unisom 1/2 tab TID     *No known complications           If received before Pain:         Known pregnancy;   Darvocet       Resume series after  Lortab        Delivery  Percocet    Yeast:   Tramadol      Femstat  Tylenol 3      Gyne-lotrimin  Ultram       Monistat  Vicodin           MISC:         All Sunscreens           Hair Coloring/highlights          Insect Repellant's          (Including DEET)         Mystic Tans   Round Ligament Pain  The round ligament is a cord of muscle and tissue that helps support the uterus. It can become a source of pain during pregnancy if it becomes stretched or twisted as the baby grows. The pain usually begins in the second trimester (13-28 weeks) of pregnancy, and it can come and go until the baby is delivered. It is not a serious problem, and it does not cause harm to the baby. Round ligament pain is usually a short, sharp, and pinching pain, but it can also be a dull, lingering, and aching pain. The pain is felt in the lower side of the abdomen or in the groin. It usually starts deep in the groin and moves up to the outside of the hip area. The pain may occur when you:  Suddenly change position, such as quickly going from a sitting to standing position.  Roll over in bed.  Cough or sneeze.  Do physical activity. Follow these instructions at home:  Watch your condition for any changes.  When the pain starts,  relax. Then try any of these methods to help with the pain: ? Sitting down. ? Flexing your knees up to your abdomen. ? Lying on your side with one pillow under your abdomen and another pillow between your legs. ? Sitting in a warm bath for 15-20 minutes or until the pain goes away.  Take over-the-counter and prescription medicines only as told by your health care provider.  Move slowly when you sit down or stand up.  Avoid long walks if they cause pain.  Stop or reduce your physical activities if they cause pain.  Keep all follow-up visits as told by your health care provider. This is important.   Contact a health care provider if:  Your pain does not go away with treatment.  You feel pain in your back that you did not have before.  Your medicine is not helping. Get help right away if:  You have a fever or chills.  You develop uterine contractions.  You  have vaginal bleeding.  You have nausea or vomiting.  You have diarrhea.  You have pain when you urinate. Summary  Round ligament pain is felt in the lower abdomen or groin. It is usually a short, sharp, and pinching pain. It can also be a dull, lingering, and aching pain.  This pain usually begins in the second trimester (13-28 weeks). It occurs because the uterus is stretching with the growing baby, and it is not harmful to the baby.  You may notice the pain when you suddenly change position, when you cough or sneeze, or during physical activity.  Relaxing, flexing your knees to your abdomen, lying on one side, or taking a warm bath may help to get rid of the pain.  Get help from your health care provider if the pain does not go away or if you have vaginal bleeding, nausea, vomiting, diarrhea, or painful urination. This information is not intended to replace advice given to you by your health care provider. Make sure you discuss any questions you have with your health care provider. Document Revised: 12/05/2017  Document Reviewed: 12/05/2017 Elsevier Patient Education  2021 Elsevier Inc.   Second Trimester of Pregnancy  The second trimester of pregnancy is from week 13 through week 27. This is also called months 4 through 6 of pregnancy. This is often the time when you feel your best. During the second trimester:  Morning sickness is less or has stopped.  You may have more energy.  You may feel hungry more often. At this time, your unborn baby (fetus) is growing very fast. At the end of the sixth month, the unborn baby may be up to 12 inches long and weigh about 1 pounds. You will likely start to feel the baby move between 16 and 20 weeks of pregnancy. Body changes during your second trimester Your body continues to go through many changes during this time. The changes vary and generally return to normal after the baby is born. Physical changes  You will gain more weight.  You may start to get stretch marks on your hips, belly (abdomen), and breasts.  Your breasts will grow and may hurt.  Dark spots or blotches may develop on your face.  A dark line from your belly button to the pubic area (linea nigra) may appear.  You may have changes in your hair. Health changes  You may have headaches.  You may have heartburn.  You may have trouble pooping (constipation).  You may have hemorrhoids or swollen, bulging veins (varicose veins).  Your gums may bleed.  You may pee (urinate) more often.  You may have back pain. Follow these instructions at home: Medicines  Take over-the-counter and prescription medicines only as told by your doctor. Some medicines are not safe during pregnancy.  Take a prenatal vitamin that contains at least 600 micrograms (mcg) of folic acid. Eating and drinking  Eat healthy meals that include: ? Fresh fruits and vegetables. ? Whole grains. ? Good sources of protein, such as meat, eggs, or tofu. ? Low-fat dairy products.  Avoid raw meat and  unpasteurized juice, milk, and cheese.  You may need to take these actions to prevent or treat trouble pooping: ? Drink enough fluids to keep your pee (urine) pale yellow. ? Eat foods that are high in fiber. These include beans, whole grains, and fresh fruits and vegetables. ? Limit foods that are high in fat and sugar. These include fried or sweet foods. Activity  Exercise only as  told by your doctor. Most people can do their usual exercise during pregnancy. Try to exercise for 30 minutes at least 5 days a week.  Stop exercising if you have pain or cramps in your belly or lower back.  Do not exercise if it is too hot or too humid, or if you are in a place of great height (high altitude).  Avoid heavy lifting.  If you choose to, you may have sex unless your doctor tells you not to. Relieving pain and discomfort  Wear a good support bra if your breasts are sore.  Take warm water baths (sitz baths) to soothe pain or discomfort caused by hemorrhoids. Use hemorrhoid cream if your doctor approves.  Rest with your legs raised (elevated) if you have leg cramps or low back pain.  If you develop bulging veins in your legs: ? Wear support hose as told by your doctor. ? Raise your feet for 15 minutes, 3-4 times a day. ? Limit salt in your food. Safety  Wear your seat belt at all times when you are in a car.  Talk with your doctor if someone is hurting you or yelling at you a lot. Lifestyle  Do not use hot tubs, steam rooms, or saunas.  Do not douche. Do not use tampons or scented sanitary pads.  Avoid cat litter boxes and soil used by cats. These carry germs that can harm your baby and can cause a loss of your baby by miscarriage or stillbirth.  Do not use herbal medicines, illegal drugs, or medicines that are not approved by your doctor. Do not drink alcohol.  Do not smoke or use any products that contain nicotine or tobacco. If you need help quitting, ask your doctor. General  instructions  Keep all follow-up visits. This is important.  Ask your doctor about local prenatal classes.  Ask your doctor about the right foods to eat or for help finding a counselor. Where to find more information  American Pregnancy Association: americanpregnancy.org  [employer]: www.acog.org  Office on [employer]: KeywordPortfolios.com.br Contact a doctor if:  You have a headache that does not go away when you take medicine.  You have changes in how you see, or you see spots in front of your eyes.  You have mild cramps, pressure, or pain in your lower belly.  You continue to feel like you may vomit (nauseous), you vomit, or you have watery poop (diarrhea).  You have bad-smelling fluid coming from your vagina.  You have pain when you pee or your pee smells bad.  You have very bad swelling of your face, hands, ankles, feet, or legs.  You have a fever. Get help right away if:  You are leaking fluid from your vagina.  You have spotting or bleeding from your vagina.  You have very bad belly cramping or pain.  You have trouble breathing.  You have chest pain.  You faint.  You have not felt your baby move for the time period told by your doctor.  You have new or increased pain, swelling, or redness in an arm or leg. Summary  The second trimester of pregnancy is from week 13 through week 27 (months 4 through 6).  Eat healthy meals.  Exercise as told by your doctor. Most people can do their usual exercise during pregnancy.  Do not use herbal medicines, illegal drugs, or medicines that are not approved by your doctor. Do not drink alcohol.  Call your doctor  if you get sick or if you notice anything unusual about your pregnancy. This information is not intended to replace advice given to you by your health care provider. Make sure you discuss any questions you have with your health care provider. Document Revised:  11/26/2019 Document Reviewed: 10/02/2019 Elsevier Patient Education  2021 [employer].

## 2020-09-27 NOTE — Progress Notes (Signed)
I have seen, interviewed, and examined the patient in conjunction with the [employer] and affirm the diagnosis and management plan.   Gunnar Bulla, CNM [facility], [facility] 09/27/20 4:06 PM

## 2020-09-27 NOTE — Progress Notes (Signed)
OB-Pt present for routine prenatal care. Pt stated that she was doing well.  

## 2020-09-27 NOTE — Progress Notes (Signed)
ROB- Notes increased anxiety since abnormal MFM ultrasound last Tuesday, see chart for further details. Symptoms improving with home treatment measures and sharing information with family members and friends. Questions regarding results and referral to higher level of care answered by CNM. Patient wishes to transfer care to [facility] MFM, see chart for referral. Anticipatory guidance regarding course of prenatal care. Reviewed red flag symptoms and when to call. RTC for prenatal visit until referral completed.    Juliann Pares, Student-MidWife [employer] 09/27/20 2:05 PM

## 2020-10-04 ENCOUNTER — Telehealth: Payer: Self-pay | Admitting: Certified Nurse Midwife

## 2020-10-04 NOTE — Telephone Encounter (Signed)
New Message:  Pt states she has been under a lot of stress lately and needs some reassurance that everything is okay with her pregnancy.  Pt did not want to go in details with me.  She states that she also sent a message through MyChart.

## 2020-10-04 NOTE — Telephone Encounter (Signed)
OB 21 weeks  Saturday at work pt states she bumped into a gate/bar at hip level.  Did not hit her belly  NO bruising No swelling + tenderness Not painful- no meds needed.  + FM No VB No LOF.   Pt advised to monitor for now. IF she has bleeding like a period, LOF, PP not relived with Tylenol or baby is not moving to contact office asap. Pt voiced understanding.

## 2020-10-11 NOTE — Telephone Encounter (Signed)
Status of referral? Thanks, JML

## 2020-10-14 ENCOUNTER — Other Ambulatory Visit: Payer: Self-pay | Admitting: Certified Nurse Midwife

## 2020-10-14 DIAGNOSIS — O99212 Obesity complicating pregnancy, second trimester: Secondary | ICD-10-CM

## 2020-10-14 DIAGNOSIS — O281 Abnormal biochemical finding on antenatal screening of mother: Secondary | ICD-10-CM

## 2020-10-25 ENCOUNTER — Encounter: Payer: Managed Care, Other (non HMO) | Admitting: Certified Nurse Midwife

## 2020-10-26 ENCOUNTER — Telehealth: Payer: Self-pay | Admitting: Obstetrics and Gynecology

## 2020-10-26 ENCOUNTER — Ambulatory Visit: Payer: Managed Care, Other (non HMO)

## 2020-10-26 NOTE — Telephone Encounter (Signed)
Called Ms. Cureton to follow up, as she was no longer on our schedule today.  She is actually at [facility] today for u/s and MFM consultation with possible transfer of care.  She will let us know if she needs any follow up here at [facility].  Cherly Anderson, MS, CGC

## 2020-10-27 ENCOUNTER — Telehealth: Payer: Self-pay | Admitting: Certified Nurse Midwife

## 2020-10-27 NOTE — Telephone Encounter (Signed)
Patient called today at 3:45, pt's prenatal care was transferred to [facility] MFM due to fetal cardiac defects. Pt had made 2 payments already of $230.52 on 01-31 and 02-28, pt is asking about a refund since care was transferred for medical reasons. Patient also inquired about outstanding bill for [place]. Pt mentioned that they are trying to prepare for the unexpected increase in prenatal and infant care verse what they were estimated in begining of pregnancy prior to DX. Please Advise. Pt did confirm best call back number was [phone]. I did speak with patient yesterday afternoon to follow up on referral - she had first apt with [facility] yesterday.

## 2020-10-29 NOTE — Telephone Encounter (Signed)
Pt aware she has 318.41 credit with [place]. I will need to change her OB visits to gyn as she is not delivering with [place].   Pt aware the credit may change. Also, if she owes another [facility] facility any money the credit will be applied to that.  Pt voiced understanding.

## 2020-11-11 ENCOUNTER — Telehealth: Payer: Self-pay | Admitting: Certified Nurse Midwife

## 2020-11-11 NOTE — Telephone Encounter (Signed)
Patient called about ob payment plan refund/ credit- pt requesting update. Please Advise.

## 2020-11-12 NOTE — Telephone Encounter (Signed)
lmtrc

## 2020-11-12 NOTE — Telephone Encounter (Signed)
Pt aware all PNV have been changed to gyn. Only balance I can see it 1.02. Pt voiced understanding. Pt states her care at [facility] is going well.

## 2020-11-19 ENCOUNTER — Telehealth: Payer: Self-pay | Admitting: Certified Nurse Midwife

## 2020-11-19 NOTE — Telephone Encounter (Signed)
Patient called asking for Casey Richardson about billing issue they have been discussing, I transferred her to c.m phone to leave a message.

## 2020-11-25 ENCOUNTER — Encounter: Payer: Managed Care, Other (non HMO) | Admitting: Certified Nurse Midwife

## 2020-11-25 NOTE — Telephone Encounter (Signed)
Emailed itemized statement to pt. Aware per VM.

## 2021-05-20 ENCOUNTER — Other Ambulatory Visit: Payer: Self-pay

## 2021-05-20 ENCOUNTER — Encounter: Payer: Self-pay | Admitting: Nurse Practitioner

## 2021-05-20 ENCOUNTER — Ambulatory Visit (INDEPENDENT_AMBULATORY_CARE_PROVIDER_SITE_OTHER): Payer: Managed Care, Other (non HMO) | Admitting: Nurse Practitioner

## 2021-05-20 VITALS — BP 136/83 | HR 57 | Temp 98.7°F | Ht 67.0 in | Wt 252.2 lb

## 2021-05-20 DIAGNOSIS — I10 Essential (primary) hypertension: Secondary | ICD-10-CM

## 2021-05-20 DIAGNOSIS — L989 Disorder of the skin and subcutaneous tissue, unspecified: Secondary | ICD-10-CM

## 2021-05-20 DIAGNOSIS — Z23 Encounter for immunization: Secondary | ICD-10-CM

## 2021-05-20 NOTE — Progress Notes (Signed)
BP 136/83   Pulse (!) 57   Temp 98.7 F (37.1 C) (Oral)   Ht 5\' 7"  (1.702 m)   Wt 252 lb 3.2 oz (114.4 kg)   LMP 04/21/2021 (Exact Date)   SpO2 97%   Breastfeeding Yes   BMI 39.50 kg/m    Subjective:    Patient ID: Casey Richardson, female    DOB: Mar 08, 1991, 30 y.o.   MRN: [id number]  HPI: Casey Richardson is a 30 y.o. female  Chief Complaint  Patient presents with   Hypertension    Patient states she had a baby in July and had preeclampsia at that time.  The OBGYN told her to follow up with primary care about weaning off of her medication. She is currently taking Labetalol 600mg  TID. Blood pressures are home at running 112-117/70s.     Denies HA, CP, SOB, dizziness, palpitations, visual changes, and lower extremity swelling.  Patient has an area under left eye.  It has been there since she was in high school.  It got bigger during pregnancy and she would like to have it looked at.  Relevant past medical, surgical, family and social history reviewed and updated as indicated. Interim medical history since our last visit reviewed. Allergies and medications reviewed and updated.  Review of Systems  Eyes:  Negative for visual disturbance.  Respiratory:  Negative for cough, chest tightness and shortness of breath.   Cardiovascular:  Negative for chest pain, palpitations and leg swelling.  Neurological:  Negative for dizziness and headaches.   Per HPI unless specifically indicated above     Objective:    BP 136/83   Pulse (!) 57   Temp 98.7 F (37.1 C) (Oral)   Ht 5\' 7"  (1.702 m)   Wt 252 lb 3.2 oz (114.4 kg)   LMP 04/21/2021 (Exact Date)   SpO2 97%   Breastfeeding Yes   BMI 39.50 kg/m   Wt Readings from Last 3 Encounters:  05/20/21 252 lb 3.2 oz (114.4 kg)  09/27/20 239 lb 9.6 oz (108.7 kg)  09/21/20 239 lb 8 oz (108.6 kg)    Physical Exam Vitals and nursing note reviewed.  Constitutional:      General: She is not in acute distress.    Appearance:  Normal appearance. She is obese. She is not ill-appearing, toxic-appearing or diaphoretic.  HENT:     Head: Normocephalic.     Right Ear: External ear normal.     Left Ear: External ear normal.     Nose: Nose normal.     Mouth/Throat:     Mouth: Mucous membranes are moist.     Pharynx: Oropharynx is clear.  Eyes:     General:        Right eye: No discharge.        Left eye: No discharge.     Extraocular Movements: Extraocular movements intact.     Conjunctiva/sclera: Conjunctivae normal.     Pupils: Pupils are equal, round, and reactive to light.  Cardiovascular:     Rate and Rhythm: Normal rate and regular rhythm.     Heart sounds: No murmur heard. Pulmonary:     Effort: Pulmonary effort is normal. No respiratory distress.     Breath sounds: Normal breath sounds. No wheezing or rales.  Musculoskeletal:     Cervical back: Normal range of motion and neck supple.  Skin:    General: Skin is warm and dry.     Capillary Refill: Capillary refill takes less than  2 seconds.       Neurological:     General: No focal deficit present.     Mental Status: She is alert and oriented to person, place, and time. Mental status is at baseline.  Psychiatric:        Mood and Affect: Mood normal.        Behavior: Behavior normal.        Thought Content: Thought content normal.        Judgment: Judgment normal.    Results for orders placed or performed in visit on 09/27/20  POC Urinalysis Dipstick OB  Result Value Ref Range   Color, UA yellow    Clarity, UA clear    Glucose, UA Negative Negative   Bilirubin, UA neg    Ketones, UA neg    Spec Grav, UA <=1.005 (A) 1.010 - 1.025   Blood, UA neg    pH, UA 6.0 5.0 - 8.0   POC,PROTEIN,UA Negative Negative, Trace, Small (1+), Moderate (2+), Large (3+), 4+   Urobilinogen, UA 0.2 0.2 or 1.0 E.U./dL   Nitrite, UA neg    Leukocytes, UA Negative Negative   Appearance yellow;clear    Odor        Assessment & Plan:   Problem List Items  Addressed This Visit       Cardiovascular and Mediastinum   Essential hypertension    Chronic. Controlled.  Blood pressures are running 117/70s. Will decrease Labetalol from 600mg  TID to 400mg  TID.  Continue to check blood pressures at home.  Follow up in 1 month for reevaluation.       Relevant Medications   labetalol (NORMODYNE) 200 MG tablet   Other Visit Diagnoses     Need for influenza vaccination    -  Primary   Relevant Orders   Flu Vaccine QUAD 88mo+IM (Fluarix, Fluzone & Alfiuria Quad PF) (Completed)   Lesion of face       Relevant Orders   Ambulatory referral to Dermatology        Follow up plan: Return in about 1 month (around 06/19/2021) for BP Check.

## 2021-05-20 NOTE — Assessment & Plan Note (Signed)
Chronic. Controlled.  Blood pressures are running 117/70s. Will decrease Labetalol from 600mg  TID to 400mg  TID.  Continue to check blood pressures at home.  Follow up in 1 month for reevaluation.

## 2021-06-07 ENCOUNTER — Encounter: Payer: Self-pay | Admitting: Nurse Practitioner

## 2021-06-13 ENCOUNTER — Ambulatory Visit: Payer: Managed Care, Other (non HMO) | Admitting: Nurse Practitioner

## 2021-06-20 ENCOUNTER — Encounter: Payer: Self-pay | Admitting: Nurse Practitioner

## 2021-06-20 ENCOUNTER — Ambulatory Visit (INDEPENDENT_AMBULATORY_CARE_PROVIDER_SITE_OTHER): Payer: Managed Care, Other (non HMO) | Admitting: Nurse Practitioner

## 2021-06-20 ENCOUNTER — Other Ambulatory Visit: Payer: Self-pay

## 2021-06-20 VITALS — BP 134/75 | HR 68 | Temp 98.7°F | Wt 252.2 lb

## 2021-06-20 DIAGNOSIS — I10 Essential (primary) hypertension: Secondary | ICD-10-CM

## 2021-06-20 NOTE — Assessment & Plan Note (Signed)
Chronic.  Controlled.  Continue with current medication regimen of Labetalol 400mg  BID.  Patient was on 50mg  of Labetalol prior to pregnancy.  She will likely need a decrease in medication but will go slowly so patient does not experience rebound hypertension.  Return to clinic in 3 months for reevaluation.  Continue to monitor at home and follow up if she experiences symptoms of hypotension.  Call sooner if concerns arise.

## 2021-06-20 NOTE — Progress Notes (Signed)
BP 134/75    Pulse 68    Temp 98.7 F (37.1 C) (Oral)    Wt 252 lb 3.2 oz (114.4 kg)    LMP 05/25/2021 (Exact Date)    SpO2 98%    BMI 39.50 kg/m    Subjective:    Patient ID: Charlies Constable, female    DOB: 20-Mar-1991, 30 y.o.   MRN: [id number]  HPI: Quintera Burkle is a 30 y.o. female  Chief Complaint  Patient presents with   Hypertension   HYPERTENSION Hypertension status: controlled  Satisfied with current treatment? no Duration of hypertension: years BP monitoring frequency:  daily BP range: 120s/70s BP medication side effects:  no Medication compliance: excellent compliance Previous BP meds:labetalol Aspirin: no Recurrent headaches: no Visual changes: no Palpitations: no Dyspnea: no Chest pain: no Lower extremity edema: no Dizzy/lightheaded: no  Relevant past medical, surgical, family and social history reviewed and updated as indicated. Interim medical history since our last visit reviewed. Allergies and medications reviewed and updated.  Review of Systems  Eyes:  Negative for visual disturbance.  Respiratory:  Negative for cough, chest tightness and shortness of breath.   Cardiovascular:  Negative for chest pain, palpitations and leg swelling.  Neurological:  Negative for dizziness and headaches.   Per HPI unless specifically indicated above     Objective:    BP 134/75    Pulse 68    Temp 98.7 F (37.1 C) (Oral)    Wt 252 lb 3.2 oz (114.4 kg)    LMP 05/25/2021 (Exact Date)    SpO2 98%    BMI 39.50 kg/m   Wt Readings from Last 3 Encounters:  06/20/21 252 lb 3.2 oz (114.4 kg)  05/20/21 252 lb 3.2 oz (114.4 kg)  09/27/20 239 lb 9.6 oz (108.7 kg)    Physical Exam Vitals and nursing note reviewed.  Constitutional:      General: She is not in acute distress.    Appearance: Normal appearance. She is normal weight. She is not ill-appearing, toxic-appearing or diaphoretic.  HENT:     Head: Normocephalic.     Right Ear: External ear normal.      Left Ear: External ear normal.     Nose: Nose normal.     Mouth/Throat:     Mouth: Mucous membranes are moist.     Pharynx: Oropharynx is clear.  Eyes:     General:        Right eye: No discharge.        Left eye: No discharge.     Extraocular Movements: Extraocular movements intact.     Conjunctiva/sclera: Conjunctivae normal.     Pupils: Pupils are equal, round, and reactive to light.  Cardiovascular:     Rate and Rhythm: Normal rate and regular rhythm.     Heart sounds: No murmur heard. Pulmonary:     Effort: Pulmonary effort is normal. No respiratory distress.     Breath sounds: Normal breath sounds. No wheezing or rales.  Musculoskeletal:     Cervical back: Normal range of motion and neck supple.  Skin:    General: Skin is warm and dry.     Capillary Refill: Capillary refill takes less than 2 seconds.  Neurological:     General: No focal deficit present.     Mental Status: She is alert and oriented to person, place, and time. Mental status is at baseline.  Psychiatric:        Mood and Affect: Mood normal.  Behavior: Behavior normal.        Thought Content: Thought content normal.        Judgment: Judgment normal.    Results for orders placed or performed in visit on 09/27/20  POC Urinalysis Dipstick OB  Result Value Ref Range   Color, UA yellow    Clarity, UA clear    Glucose, UA Negative Negative   Bilirubin, UA neg    Ketones, UA neg    Spec Grav, UA <=1.005 (A) 1.010 - 1.025   Blood, UA neg    pH, UA 6.0 5.0 - 8.0   POC,PROTEIN,UA Negative Negative, Trace, Small (1+), Moderate (2+), Large (3+), 4+   Urobilinogen, UA 0.2 0.2 or 1.0 E.U./dL   Nitrite, UA neg    Leukocytes, UA Negative Negative   Appearance yellow;clear    Odor        Assessment & Plan:   Problem List Items Addressed This Visit       Cardiovascular and Mediastinum   Essential hypertension - Primary    Chronic.  Controlled.  Continue with current medication regimen of Labetalol  400mg  BID.  Patient was on 50mg  of Labetalol prior to pregnancy.  She will likely need a decrease in medication but will go slowly so patient does not experience rebound hypertension.  Return to clinic in 3 months for reevaluation.  Continue to monitor at home and follow up if she experiences symptoms of hypotension.  Call sooner if concerns arise.        Relevant Medications   labetalol (NORMODYNE) 200 MG tablet     Follow up plan: Return in about 3 months (around 09/18/2021) for BP Check.

## 2021-07-25 ENCOUNTER — Encounter: Payer: Self-pay | Admitting: Nurse Practitioner

## 2021-07-25 MED ORDER — LABETALOL HCL 200 MG PO TABS: 400.0000 mg | ORAL_TABLET | Freq: Two times a day (BID) | ORAL | 1 refills | Status: DC

## 2021-09-19 ENCOUNTER — Ambulatory Visit: Payer: Managed Care, Other (non HMO) | Admitting: Nurse Practitioner

## 2021-09-19 ENCOUNTER — Encounter: Payer: Self-pay | Admitting: Nurse Practitioner

## 2021-09-19 ENCOUNTER — Other Ambulatory Visit: Payer: Self-pay

## 2021-09-19 VITALS — BP 129/75 | HR 65 | Temp 98.5°F | Wt 253.8 lb

## 2021-09-19 DIAGNOSIS — I10 Essential (primary) hypertension: Secondary | ICD-10-CM

## 2021-09-19 NOTE — Assessment & Plan Note (Signed)
Chronic.  Controlled.  Continue with current medication regimen of Labetolol 400mg  BID.  Recommend checking blood pressure at home and bringing log to next visit.  Return to clinic in 3 months for reevaluation.  Call sooner if concerns arise.  ? ?

## 2021-09-19 NOTE — Progress Notes (Signed)
? ?BP 129/75   Pulse 65   Temp 98.5 ?F (36.9 ?C) (Oral)   Wt 253 lb 12.8 oz (115.1 kg)   LMP 08/30/2021 (Exact Date)   SpO2 98%   BMI 39.75 kg/m?   ? ?Subjective:  ? ? Patient ID: Casey Richardson, female    DOB: Nov 27, 1990, 31 y.o.   MRN: [id number] ? ?HPI: ?Casey Richardson is a 31 y.o. female ? ?Chief Complaint  ?Patient presents with  ? Hypertension  ? ?HYPERTENSION ?Hypertension status: controlled  ?Satisfied with current treatment? yes ?Duration of hypertension: years ?BP monitoring frequency:  not checking ?BP range:  ?BP medication side effects:  no ?Medication compliance: excellent compliance ?Previous BP meds:labetalol ?Aspirin: no ?Recurrent headaches: no ?Visual changes: no ?Palpitations: no ?Dyspnea: no ?Chest pain: no ?Lower extremity edema: no ?Dizzy/lightheaded: no ? ?Relevant past medical, surgical, family and social history reviewed and updated as indicated. Interim medical history since our last visit reviewed. ?Allergies and medications reviewed and updated. ? ?Review of Systems  ?Eyes:  Negative for visual disturbance.  ?Respiratory:  Negative for cough, chest tightness and shortness of breath.   ?Cardiovascular:  Negative for chest pain, palpitations and leg swelling.  ?Neurological:  Negative for dizziness and headaches.  ? ?Per HPI unless specifically indicated above ? ?   ?Objective:  ?  ?BP 129/75   Pulse 65   Temp 98.5 ?F (36.9 ?C) (Oral)   Wt 253 lb 12.8 oz (115.1 kg)   LMP 08/30/2021 (Exact Date)   SpO2 98%   BMI 39.75 kg/m?   ?Wt Readings from Last 3 Encounters:  ?09/19/21 253 lb 12.8 oz (115.1 kg)  ?06/20/21 252 lb 3.2 oz (114.4 kg)  ?05/20/21 252 lb 3.2 oz (114.4 kg)  ?  ?Physical Exam ?Vitals and nursing note reviewed.  ?Constitutional:   ?   General: She is not in acute distress. ?   Appearance: Normal appearance. She is normal weight. She is not ill-appearing, toxic-appearing or diaphoretic.  ?HENT:  ?   Head: Normocephalic.  ?   Right Ear: External ear normal.  ?    Left Ear: External ear normal.  ?   Nose: Nose normal.  ?   Mouth/Throat:  ?   Mouth: Mucous membranes are moist.  ?   Pharynx: Oropharynx is clear.  ?Eyes:  ?   General:     ?   Right eye: No discharge.     ?   Left eye: No discharge.  ?   Extraocular Movements: Extraocular movements intact.  ?   Conjunctiva/sclera: Conjunctivae normal.  ?   Pupils: Pupils are equal, round, and reactive to light.  ?Cardiovascular:  ?   Rate and Rhythm: Normal rate and regular rhythm.  ?   Heart sounds: No murmur heard. ?Pulmonary:  ?   Effort: Pulmonary effort is normal. No respiratory distress.  ?   Breath sounds: Normal breath sounds. No wheezing or rales.  ?Musculoskeletal:  ?   Cervical back: Normal range of motion and neck supple.  ?Skin: ?   General: Skin is warm and dry.  ?   Capillary Refill: Capillary refill takes less than 2 seconds.  ?Neurological:  ?   General: No focal deficit present.  ?   Mental Status: She is alert and oriented to person, place, and time. Mental status is at baseline.  ?Psychiatric:     ?   Mood and Affect: Mood normal.     ?   Behavior: Behavior normal.     ?  Thought Content: Thought content normal.     ?   Judgment: Judgment normal.  ? ? ?Results for orders placed or performed in visit on 09/27/20  ?POC Urinalysis Dipstick OB  ?Result Value Ref Range  ? Color, UA yellow   ? Clarity, UA clear   ? Glucose, UA Negative Negative  ? Bilirubin, UA neg   ? Ketones, UA neg   ? Spec Grav, UA <=1.005 (A) 1.010 - 1.025  ? Blood, UA neg   ? pH, UA 6.0 5.0 - 8.0  ? POC,PROTEIN,UA Negative Negative, Trace, Small (1+), Moderate (2+), Large (3+), 4+  ? Urobilinogen, UA 0.2 0.2 or 1.0 E.U./dL  ? Nitrite, UA neg   ? Leukocytes, UA Negative Negative  ? Appearance yellow;clear   ? Odor    ? ?   ?Assessment & Plan:  ? ?Problem List Items Addressed This Visit   ? ?  ? Cardiovascular and Mediastinum  ? Essential hypertension - Primary  ?  Chronic.  Controlled.  Continue with current medication regimen of Labetolol  400mg  BID.  Recommend checking blood pressure at home and bringing log to next visit.  Return to clinic in 3 months for reevaluation.  Call sooner if concerns arise.  ? ?  ?  ?  ? ?Follow up plan: ?Return in about 3 months (around 12/20/2021) for Physical and Fasting labs. ? ? ? ? ? ?

## 2021-12-21 ENCOUNTER — Encounter: Payer: Managed Care, Other (non HMO) | Admitting: Nurse Practitioner

## 2022-01-20 ENCOUNTER — Ambulatory Visit (INDEPENDENT_AMBULATORY_CARE_PROVIDER_SITE_OTHER): Payer: Managed Care, Other (non HMO) | Admitting: Nurse Practitioner

## 2022-01-20 ENCOUNTER — Encounter: Payer: Self-pay | Admitting: Nurse Practitioner

## 2022-01-20 ENCOUNTER — Other Ambulatory Visit (HOSPITAL_COMMUNITY)
Admission: RE | Admit: 2022-01-20 | Discharge: 2022-01-20 | Disposition: A | Discharge status: Home / Self Care | Payer: Managed Care, Other (non HMO) | Source: Ambulatory Visit | Attending: Nurse Practitioner | Admitting: Nurse Practitioner

## 2022-01-20 VITALS — BP 130/81 | HR 70 | Temp 98.9°F | Ht 67.42 in | Wt 253.6 lb

## 2022-01-20 DIAGNOSIS — F418 Other specified anxiety disorders: Secondary | ICD-10-CM

## 2022-01-20 DIAGNOSIS — Z0000 Encounter for general adult medical examination without abnormal findings: Secondary | ICD-10-CM | POA: Diagnosis not present

## 2022-01-20 DIAGNOSIS — I10 Essential (primary) hypertension: Secondary | ICD-10-CM | POA: Diagnosis not present

## 2022-01-20 DIAGNOSIS — Z136 Encounter for screening for cardiovascular disorders: Secondary | ICD-10-CM

## 2022-01-20 DIAGNOSIS — O99345 Other mental disorders complicating the puerperium: Secondary | ICD-10-CM

## 2022-01-20 LAB — URINALYSIS, ROUTINE W REFLEX MICROSCOPIC
Bilirubin, UA: NEGATIVE
Glucose, UA: NEGATIVE
Ketones, UA: NEGATIVE
Leukocytes,UA: NEGATIVE
Nitrite, UA: NEGATIVE
Protein,UA: NEGATIVE
RBC, UA: NEGATIVE
Specific Gravity, UA: 1.015 (ref 1.005–1.030)
Urobilinogen, Ur: 0.2 mg/dL (ref 0.2–1.0)
pH, UA: 6 (ref 5.0–7.5)

## 2022-01-20 MED ORDER — SERTRALINE HCL 25 MG PO TABS
25.0000 mg | ORAL_TABLET | Freq: Every day | ORAL | 1 refills | Status: DC
Start: 2022-01-20 — End: 2022-07-11

## 2022-01-20 NOTE — Assessment & Plan Note (Addendum)
Chronic.  Controlled.  Continue with current medication regimen of Labetalol 400mg BID.  Labs ordered today.  Return to clinic in 6 months for reevaluation.  Call sooner if concerns arise.   

## 2022-01-20 NOTE — Assessment & Plan Note (Signed)
Chronic.  Controlled.  Continue with current medication regimen of Zoloft 25mg.  Labs ordered today.  Return to clinic in 6 months for reevaluation.  Call sooner if concerns arise.   

## 2022-01-20 NOTE — Progress Notes (Signed)
BP 130/81   Pulse 70   Temp 98.9 F (37.2 C) (Oral)   Ht 5' 7.42" (1.712 m)   Wt 253 lb 9.6 oz (115 kg)   LMP 01/01/2022 (Exact Date)   SpO2 98%   Breastfeeding No   BMI 39.22 kg/m    Subjective:    Patient ID: Casey Richardson, female    DOB: 10/17/1990, 31 y.o.   MRN: [id number]  HPI: Casey Richardson is a 31 y.o. female presenting on 01/20/2022 for comprehensive medical examination. Current medical complaints include: Would like her hormones checked due to being 1 year post partum  She currently lives with: Menopausal Symptoms: no  HYPERTENSION Hypertension status: controlled  Satisfied with current treatment? no Duration of hypertension: years BP monitoring frequency:  not checking BP range:  BP medication side effects:  no Medication compliance: excellent compliance Previous BP meds:labetalol Aspirin: no Recurrent headaches: no Visual changes: no Palpitations: no Dyspnea: no Chest pain: no Lower extremity edema: no Dizzy/lightheaded: no  MOOD Patient states she feels like to Zoloft is working well for her.  She feels like it is helping with her overall mood.     Depression Screen done today and results listed below:     01/20/2022    8:27 AM 09/19/2021   10:40 AM 05/20/2021    3:54 PM 08/26/2020    8:07 AM 10/29/2019    4:04 PM  Depression screen PHQ 2/9  Decreased Interest 0 0 0 0 0  Down, Depressed, Hopeless 0 0 0 0 0  PHQ - 2 Score 0 0 0 0 0  Altered sleeping 0 0 0    Tired, decreased energy 1 0 1    Change in appetite 1 0 0    Feeling bad or failure about yourself  0 0 0    Trouble concentrating 0 0 0    Moving slowly or fidgety/restless 0 0 0    Suicidal thoughts 0 0 0    PHQ-9 Score 2 0 1    Difficult doing work/chores Not difficult at all Not difficult at all       The patient does not have a history of falls. I did complete a risk assessment for falls. A plan of care for falls was documented.   Past Medical History:  Past Medical  History:  Diagnosis Date   Hearing loss    right    Hypertension     Surgical History:  Past Surgical History:  Procedure Laterality Date   APPENDECTOMY     20 years   CESAREAN SECTION     EAR CYST EXCISION     nasal polyps      Medications:  Current Outpatient Medications on File Prior to Visit  Medication Sig   labetalol (NORMODYNE) 200 MG tablet Take 2 tablets (400 mg total) by mouth 2 (two) times daily.   Prenatal Vit-Fe Fumarate-FA (PRENATAL VITAMINS PO) Take by mouth daily.   sertraline (ZOLOFT) 25 MG tablet Take 25 mg by mouth daily.   No current facility-administered medications on file prior to visit.    Allergies:  No Known Allergies  Social History:  Social History   Socioeconomic History   Marital status: Married    Spouse name: Not on file   Number of children: Not on file   Years of education: Not on file   Highest education level: Not on file  Occupational History   Not on file  Tobacco Use   Smoking status: Never  Smokeless tobacco: Never  Vaping Use   Vaping Use: Never used  Substance and Sexual Activity   Alcohol use: Not Currently    Comment: rare   Drug use: Never   Sexual activity: Yes    Birth control/protection: None  Other Topics Concern   Not on file  Social History Narrative   Not on file   Social Determinants of Health   Financial Resource Strain: Not on file  Food Insecurity: Not on file  Transportation Needs: Not on file  Physical Activity: Not on file  Stress: Not on file  Social Connections: Not on file  Intimate Partner Violence: Not on file   Social History   Tobacco Use  Smoking Status Never  Smokeless Tobacco Never   Social History   Substance and Sexual Activity  Alcohol Use Not Currently   Comment: rare    Family History:  Family History  Problem Relation Age of Onset   Hyperlipidemia Mother    Hashimoto's thyroiditis Mother    Hypothyroidism Mother    Anxiety disorder Brother     Post-traumatic stress disorder Brother    Heart defect Daughter    Down syndrome Daughter    Cancer Maternal Uncle    Aneurysm Maternal Uncle    Arthritis Maternal Grandmother    Cancer Maternal Grandfather    Cancer Paternal Grandmother    Anemia Paternal Grandmother    Cancer Paternal Grandfather     Past medical history, surgical history, medications, allergies, family history and social history reviewed with patient today and changes made to appropriate areas of the chart.   Review of Systems  Eyes:  Negative for blurred vision and double vision.  Respiratory:  Negative for shortness of breath.   Cardiovascular:  Negative for chest pain, palpitations and leg swelling.  Neurological:  Negative for dizziness and headaches.   All other ROS negative except what is listed above and in the HPI.      Objective:    BP 130/81   Pulse 70   Temp 98.9 F (37.2 C) (Oral)   Ht 5' 7.42" (1.712 m)   Wt 253 lb 9.6 oz (115 kg)   LMP 01/01/2022 (Exact Date)   SpO2 98%   Breastfeeding No   BMI 39.22 kg/m   Wt Readings from Last 3 Encounters:  01/20/22 253 lb 9.6 oz (115 kg)  09/19/21 253 lb 12.8 oz (115.1 kg)  06/20/21 252 lb 3.2 oz (114.4 kg)    Physical Exam Vitals and nursing note reviewed. Exam conducted with a chaperone present [facility] [place], [place]).  Constitutional:      General: She is awake. She is not in acute distress.    Appearance: Normal appearance. She is well-developed. She is obese. She is not ill-appearing.  HENT:     Head: Normocephalic and atraumatic.     Right Ear: Hearing, tympanic membrane, ear canal and external ear normal. No drainage.     Left Ear: Hearing, tympanic membrane, ear canal and external ear normal. No drainage.     Nose: Nose normal.     Right Sinus: No maxillary sinus tenderness or frontal sinus tenderness.     Left Sinus: No maxillary sinus tenderness or frontal sinus tenderness.     Mouth/Throat:     Mouth: Mucous membranes are moist.      Pharynx: Oropharynx is clear. Uvula midline. No pharyngeal swelling, oropharyngeal exudate or posterior oropharyngeal erythema.  Eyes:     General: Lids are normal.  Right eye: No discharge.        Left eye: No discharge.     Extraocular Movements: Extraocular movements intact.     Conjunctiva/sclera: Conjunctivae normal.     Pupils: Pupils are equal, round, and reactive to light.     Visual Fields: Right eye visual fields normal and left eye visual fields normal.  Neck:     Thyroid: No thyromegaly.     Vascular: No carotid bruit.     Trachea: Trachea normal.  Cardiovascular:     Rate and Rhythm: Normal rate and regular rhythm.     Heart sounds: Normal heart sounds. No murmur heard.    No gallop.  Pulmonary:     Effort: Pulmonary effort is normal. No accessory muscle usage or respiratory distress.     Breath sounds: Normal breath sounds.  Chest:  Breasts:    Right: Normal.     Left: Normal.  Abdominal:     General: Bowel sounds are normal.     Palpations: Abdomen is soft. There is no hepatomegaly or splenomegaly.     Tenderness: There is no abdominal tenderness.  Genitourinary:    Vagina: Normal.     Cervix: Normal.     Adnexa: Right adnexa normal and left adnexa normal.  Musculoskeletal:        General: Normal range of motion.     Cervical back: Normal range of motion and neck supple.     Right lower leg: No edema.     Left lower leg: No edema.  Lymphadenopathy:     Head:     Right side of head: No submental, submandibular, tonsillar, preauricular or posterior auricular adenopathy.     Left side of head: No submental, submandibular, tonsillar, preauricular or posterior auricular adenopathy.     Cervical: No cervical adenopathy.     Upper Body:     Right upper body: No supraclavicular, axillary or pectoral adenopathy.     Left upper body: No supraclavicular, axillary or pectoral adenopathy.  Skin:    General: Skin is warm and dry.     Capillary Refill: Capillary  refill takes less than 2 seconds.     Findings: No rash.  Neurological:     Mental Status: She is alert and oriented to person, place, and time.     Gait: Gait is intact.     Deep Tendon Reflexes: Reflexes are normal and symmetric.     Reflex Scores:      Brachioradialis reflexes are 2+ on the right side and 2+ on the left side.      Patellar reflexes are 2+ on the right side and 2+ on the left side. Psychiatric:        Attention and Perception: Attention normal.        Mood and Affect: Mood normal.        Speech: Speech normal.        Behavior: Behavior normal. Behavior is cooperative.        Thought Content: Thought content normal.        Judgment: Judgment normal.     Results for orders placed or performed in visit on 09/27/20  POC Urinalysis Dipstick OB  Result Value Ref Range   Color, UA yellow    Clarity, UA clear    Glucose, UA Negative Negative   Bilirubin, UA neg    Ketones, UA neg    Spec Grav, UA <=1.005 (A) 1.010 - 1.025   Blood, UA neg  pH, UA 6.0 5.0 - 8.0   POC,PROTEIN,UA Negative Negative, Trace, Small (1+), Moderate (2+), Large (3+), 4+   Urobilinogen, UA 0.2 0.2 or 1.0 E.U./dL   Nitrite, UA neg    Leukocytes, UA Negative Negative   Appearance yellow;clear    Odor        Assessment & Plan:   Problem List Items Addressed This Visit       Cardiovascular and Mediastinum   Essential hypertension    Chronic.  Controlled.  Continue with current medication regimen of Labetalol 400mg  BID.  Labs ordered today.  Return to clinic in 6 months for reevaluation.  Call sooner if concerns arise.        Relevant Orders   Comprehensive metabolic panel     Other   Postpartum anxiety    Chronic.  Controlled.  Continue with current medication regimen of Zoloft 25mg .  Labs ordered today.  Return to clinic in 6 months for reevaluation.  Call sooner if concerns arise.        Relevant Orders   FSH/LH   Estrogens, Total   Other Visit Diagnoses     Annual  physical exam    -  Primary   Health maintenance reviewed during visit. TDAP up to date.  PAP done at visit today.    Relevant Orders   CBC with Differential/Platelet   Comprehensive metabolic panel   Lipid panel   TSH   Urinalysis, Routine w reflex microscopic   Cytology - PAP   Screening for ischemic heart disease       Relevant Orders   Lipid panel        Follow up plan: Return in about 6 months (around 07/23/2022) for BP Check, Depression/Anxiety FU.   LABORATORY TESTING:  - Pap smear: pap done  IMMUNIZATIONS:   - Tdap: Tetanus vaccination status reviewed: last tetanus booster within 10 years. - Influenza: Postponed to flu season - Pneumovax: Not applicable - Prevnar: Not applicable - COVID: Not applicable - HPV: Not applicable - Shingrix vaccine: Not applicable  SCREENING: -Mammogram: Not applicable  - Colonoscopy: Not applicable  - Bone Density: Not applicable  -Hearing Test: Not applicable  -Spirometry: Not applicable   PATIENT COUNSELING:   Advised to take 1 mg of folate supplement per day if capable of pregnancy.   Sexuality: Discussed sexually transmitted diseases, partner selection, use of condoms, avoidance of unintended pregnancy  and contraceptive alternatives.   Advised to avoid cigarette smoking.  I discussed with the patient that most people either abstain from alcohol or drink within safe limits (<=14/week and <=4 drinks/occasion for males, <=7/weeks and <= 3 drinks/occasion for females) and that the risk for alcohol disorders and other health effects rises proportionally with the number of drinks per week and how often a drinker exceeds daily limits.  Discussed cessation/primary prevention of drug use and availability of treatment for abuse.   Diet: Encouraged to adjust caloric intake to maintain  or achieve ideal body weight, to reduce intake of dietary saturated fat and total fat, to limit sodium intake by avoiding high sodium foods and not adding  table salt, and to maintain adequate dietary potassium and calcium preferably from fresh fruits, vegetables, and low-fat dairy products.    stressed the importance of regular exercise  Injury prevention: Discussed safety belts, safety helmets, smoke detector, smoking near bedding or upholstery.   Dental health: Discussed importance of regular tooth brushing, flossing, and dental visits.    NEXT PREVENTATIVE PHYSICAL DUE IN 1  YEAR. Return in about 6 months (around 07/23/2022) for BP Check, Depression/Anxiety FU.

## 2022-01-22 ENCOUNTER — Encounter: Payer: Self-pay | Admitting: Nurse Practitioner

## 2022-01-23 NOTE — Progress Notes (Signed)
Hi Casey Richardson.  It was good to see you last week.  Your lab work shows that your cholesterol is elevated.  I recommend working on a low fat diet and exercising 5x weekly for at least 30 minutes. Your thyroid labs and hormones look good.  No concerns at this time.  Continue with your current medication regimen.  Follow up as discussed.

## 2022-01-26 LAB — ESTROGENS, TOTAL: Estrogen: 776 pg/mL

## 2022-01-26 LAB — CBC WITH DIFFERENTIAL/PLATELET
Basophils Absolute: 0.1 10*3/uL (ref 0.0–0.2)
Basos: 1 %
EOS (ABSOLUTE): 0.1 10*3/uL (ref 0.0–0.4)
Eos: 2 %
Hematocrit: 39.1 % (ref 34.0–46.6)
Hemoglobin: 12.8 g/dL (ref 11.1–15.9)
Immature Grans (Abs): 0 10*3/uL (ref 0.0–0.1)
Immature Granulocytes: 1 %
Lymphocytes Absolute: 1.6 10*3/uL (ref 0.7–3.1)
Lymphs: 25 %
MCH: 27.9 pg (ref 26.6–33.0)
MCHC: 32.7 g/dL (ref 31.5–35.7)
MCV: 85 fL (ref 79–97)
Monocytes Absolute: 0.4 10*3/uL (ref 0.1–0.9)
Monocytes: 6 %
Neutrophils Absolute: 4.3 10*3/uL (ref 1.4–7.0)
Neutrophils: 65 %
Platelets: 271 10*3/uL (ref 150–450)
RBC: 4.58 x10E6/uL (ref 3.77–5.28)
RDW: 13.2 % (ref 11.7–15.4)
WBC: 6.5 10*3/uL (ref 3.4–10.8)

## 2022-01-26 LAB — CYTOLOGY - PAP: Diagnosis: NEGATIVE

## 2022-01-26 LAB — COMPREHENSIVE METABOLIC PANEL
ALT: 20 IU/L (ref 0–32)
AST: 15 IU/L (ref 0–40)
Albumin/Globulin Ratio: 1.8 (ref 1.2–2.2)
Albumin: 4.7 g/dL (ref 3.9–4.9)
Alkaline Phosphatase: 85 IU/L (ref 44–121)
BUN/Creatinine Ratio: 17 (ref 9–23)
BUN: 12 mg/dL (ref 6–20)
Bilirubin Total: 0.3 mg/dL (ref 0.0–1.2)
CO2: 25 mmol/L (ref 20–29)
Calcium: 9.6 mg/dL (ref 8.7–10.2)
Chloride: 101 mmol/L (ref 96–106)
Creatinine, Ser: 0.72 mg/dL (ref 0.57–1.00)
Globulin, Total: 2.6 g/dL (ref 1.5–4.5)
Glucose: 80 mg/dL (ref 70–99)
Potassium: 4.2 mmol/L (ref 3.5–5.2)
Sodium: 138 mmol/L (ref 134–144)
Total Protein: 7.3 g/dL (ref 6.0–8.5)
eGFR: 114 mL/min/{1.73_m2} (ref >59)

## 2022-01-26 LAB — LIPID PANEL
Chol/HDL Ratio: 5.7 ratio — ABNORMAL HIGH (ref 0.0–4.4)
Cholesterol, Total: 229 mg/dL — ABNORMAL HIGH (ref 100–199)
HDL: 40 mg/dL (ref >39)
LDL Chol Calc (NIH): 156 mg/dL — ABNORMAL HIGH (ref 0–99)
Triglycerides: 179 mg/dL — ABNORMAL HIGH (ref 0–149)
VLDL Cholesterol Cal: 33 mg/dL (ref 5–40)

## 2022-01-26 LAB — TSH: TSH: 1.15 u[IU]/mL (ref 0.450–4.500)

## 2022-01-26 LAB — FSH/LH
FSH: 18.1 m[IU]/mL
LH: 61.5 m[IU]/mL

## 2022-01-26 MED ORDER — FLUCONAZOLE 150 MG PO TABS: 150.0000 mg | ORAL_TABLET | Freq: Once | ORAL | 0 refills | Status: AC

## 2022-01-26 NOTE — Addendum Note (Signed)
Addended by: Larae Grooms on: 01/26/2022 02:19 PM   Modules accepted: Orders

## 2022-01-26 NOTE — Progress Notes (Signed)
Hi Casey Richardson.  Your PAP results were normal.  However, it did show that you have some yeast.  I have sent in a prescription for diflucan to treat this.  Please let me know if you have any questions.

## 2022-01-30 ENCOUNTER — Encounter: Payer: Self-pay | Admitting: Nurse Practitioner

## 2022-07-10 ENCOUNTER — Other Ambulatory Visit: Payer: Self-pay | Admitting: Nurse Practitioner

## 2022-07-11 NOTE — Telephone Encounter (Signed)
Requested Prescriptions  Pending Prescriptions Disp Refills   sertraline (ZOLOFT) 25 MG tablet [Pharmacy Med Name: SERTRALINE HCL TABS 25MG ] 90 tablet 1    Sig: TAKE 1 TABLET DAILY     Psychiatry:  Antidepressants - SSRI - sertraline Passed - 07/10/2022  2:10 AM      Passed - AST in normal range and within 360 days    AST  Date Value Ref Range Status  01/20/2022 15 0 - 40 IU/L Final         Passed - ALT in normal range and within 360 days    ALT  Date Value Ref Range Status  01/20/2022 20 0 - 32 IU/L Final         Passed - Completed PHQ-2 or PHQ-9 in the last 360 days      Passed - Valid encounter within last 6 months    Recent Outpatient Visits           5 months ago Annual physical exam   [facility] Jon Billings, NP   9 months ago Essential hypertension   [facility] Jon Billings, NP   1 year ago Essential hypertension   [facility] Jon Billings, NP   1 year ago Need for influenza vaccination   [facility] Jon Billings, NP   2 years ago Essential hypertension   [facility] Eulogio Bear, NP       Future Appointments             In 1 week Jon Billings, NP [facility] [place]

## 2022-07-21 NOTE — Progress Notes (Signed)
BP 131/74   Pulse 68   Temp 99 F (37.2 C) (Oral)   Wt 257 lb 3.2 oz (116.7 kg)   SpO2 98%   BMI 39.78 kg/m    Subjective:    Patient ID: Casey Richardson, female    DOB: 1991/05/31, 32 y.o.   MRN: [id number]  HPI: Casey Richardson is a 32 y.o. female  Chief Complaint  Patient presents with   Depression   Hypertension   HYPERTENSION Hypertension status: controlled  Satisfied with current treatment? yes Duration of hypertension: years BP monitoring frequency:  not checking BP range:  BP medication side effects:  no Medication compliance: excellent compliance Previous BP meds:labetalol Aspirin: no Recurrent headaches: no Visual changes: no Palpitations: no Dyspnea: no Chest pain: no Lower extremity edema: no Dizzy/lightheaded: no  MOOD Patient states he mood has been good.  She feels like she could stop the medication.  She also would like to start trying for a second baby soon and wondering if that will restart her symptoms.     Relevant past medical, surgical, family and social history reviewed and updated as indicated. Interim medical history since our last visit reviewed. Allergies and medications reviewed and updated.  Review of Systems  Eyes:  Negative for visual disturbance.  Respiratory:  Negative for cough, chest tightness and shortness of breath.   Cardiovascular:  Negative for chest pain, palpitations and leg swelling.  Neurological:  Negative for dizziness and headaches.  Psychiatric/Behavioral:  Negative for dysphoric mood and suicidal ideas. The patient is nervous/anxious.     Per HPI unless specifically indicated above     Objective:    BP 131/74   Pulse 68   Temp 99 F (37.2 C) (Oral)   Wt 257 lb 3.2 oz (116.7 kg)   SpO2 98%   BMI 39.78 kg/m   Wt Readings from Last 3 Encounters:  07/24/22 257 lb 3.2 oz (116.7 kg)  01/20/22 253 lb 9.6 oz (115 kg)  09/19/21 253 lb 12.8 oz (115.1 kg)    Physical Exam Vitals and nursing note  reviewed.  Constitutional:      General: She is not in acute distress.    Appearance: Normal appearance. She is normal weight. She is not ill-appearing, toxic-appearing or diaphoretic.  HENT:     Head: Normocephalic.     Right Ear: External ear normal.     Left Ear: External ear normal.     Nose: Nose normal.     Mouth/Throat:     Mouth: Mucous membranes are moist.     Pharynx: Oropharynx is clear.  Eyes:     General:        Right eye: No discharge.        Left eye: No discharge.     Extraocular Movements: Extraocular movements intact.     Conjunctiva/sclera: Conjunctivae normal.     Pupils: Pupils are equal, round, and reactive to light.  Cardiovascular:     Rate and Rhythm: Normal rate and regular rhythm.     Heart sounds: No murmur heard. Pulmonary:     Effort: Pulmonary effort is normal. No respiratory distress.     Breath sounds: Normal breath sounds. No wheezing or rales.  Musculoskeletal:     Cervical back: Normal range of motion and neck supple.  Skin:    General: Skin is warm and dry.     Capillary Refill: Capillary refill takes less than 2 seconds.  Neurological:     General: No focal deficit present.  Mental Status: She is alert and oriented to person, place, and time. Mental status is at baseline.  Psychiatric:        Mood and Affect: Mood normal.        Behavior: Behavior normal.        Thought Content: Thought content normal.        Judgment: Judgment normal.     Results for orders placed or performed in visit on 01/20/22  CBC with Differential/Platelet  Result Value Ref Range   WBC 6.5 3.4 - 10.8 x10E3/uL   RBC 4.58 3.77 - 5.28 x10E6/uL   Hemoglobin 12.8 11.1 - 15.9 g/dL   Hematocrit 39.1 34.0 - 46.6 %   MCV 85 79 - 97 fL   MCH 27.9 26.6 - 33.0 pg   MCHC 32.7 31.5 - 35.7 g/dL   RDW 13.2 11.7 - 15.4 %   Platelets 271 150 - 450 x10E3/uL   Neutrophils 65 Not Estab. %   Lymphs 25 Not Estab. %   Monocytes 6 Not Estab. %   Eos 2 Not Estab. %    Basos 1 Not Estab. %   Neutrophils Absolute 4.3 1.4 - 7.0 x10E3/uL   Lymphocytes Absolute 1.6 0.7 - 3.1 x10E3/uL   Monocytes Absolute 0.4 0.1 - 0.9 x10E3/uL   EOS (ABSOLUTE) 0.1 0.0 - 0.4 x10E3/uL   Basophils Absolute 0.1 0.0 - 0.2 x10E3/uL   Immature Granulocytes 1 Not Estab. %   Immature Grans (Abs) 0.0 0.0 - 0.1 x10E3/uL  Comprehensive metabolic panel  Result Value Ref Range   Glucose 80 70 - 99 mg/dL   BUN 12 6 - 20 mg/dL   Creatinine, Ser 0.72 0.57 - 1.00 mg/dL   eGFR 114 >59 mL/min/1.73   BUN/Creatinine Ratio 17 9 - 23   Sodium 138 134 - 144 mmol/L   Potassium 4.2 3.5 - 5.2 mmol/L   Chloride 101 96 - 106 mmol/L   CO2 25 20 - 29 mmol/L   Calcium 9.6 8.7 - 10.2 mg/dL   Total Protein 7.3 6.0 - 8.5 g/dL   Albumin 4.7 3.9 - 4.9 g/dL   Globulin, Total 2.6 1.5 - 4.5 g/dL   Albumin/Globulin Ratio 1.8 1.2 - 2.2   Bilirubin Total 0.3 0.0 - 1.2 mg/dL   Alkaline Phosphatase 85 44 - 121 IU/L   AST 15 0 - 40 IU/L   ALT 20 0 - 32 IU/L  Lipid panel  Result Value Ref Range   Cholesterol, Total 229 (H) 100 - 199 mg/dL   Triglycerides 179 (H) 0 - 149 mg/dL   HDL 40 >39 mg/dL   VLDL Cholesterol Cal 33 5 - 40 mg/dL   LDL Chol Calc (NIH) 156 (H) 0 - 99 mg/dL   Chol/HDL Ratio 5.7 (H) 0.0 - 4.4 ratio  TSH  Result Value Ref Range   TSH 1.150 0.450 - 4.500 uIU/mL  Urinalysis, Routine w reflex microscopic  Result Value Ref Range   Specific Gravity, UA 1.015 1.005 - 1.030   pH, UA 6.0 5.0 - 7.5   Color, UA Yellow Yellow   Appearance Ur Cloudy (A) Clear   Leukocytes,UA Negative Negative   Protein,UA Negative Negative/Trace   Glucose, UA Negative Negative   Ketones, UA Negative Negative   RBC, UA Negative Negative   Bilirubin, UA Negative Negative   Urobilinogen, Ur 0.2 0.2 - 1.0 mg/dL   Nitrite, UA Negative Negative  FSH/LH  Result Value Ref Range   LH 61.5 mIU/mL   FSH 18.1 mIU/mL  Estrogens, Total  Result Value Ref Range   Estrogen 776 pg/mL  Cytology - PAP  Result Value Ref  Range   Adequacy      Satisfactory for evaluation; transformation zone component PRESENT.   Diagnosis      - Negative for intraepithelial lesion or malignancy (NILM)   Microorganisms      Fungal organisms present consistent with Candida spp.      Assessment & Plan:   Problem List Items Addressed This Visit       Cardiovascular and Mediastinum   Essential hypertension - Primary    Chronic.  Controlled.  Continue with current medication regimen of Labetalol 400mg  BID.  Labs ordered today.  Return to clinic in 6 months for reevaluation.  Call sooner if concerns arise.        Relevant Orders   Comp Met (CMET)     Other   Postpartum anxiety    Chronic.  Controlled.  Continue with current medication regimen of Zoloft 25mg .  Discussed how to properly wean off medication.  Okay to continue if she chooses.  Labs ordered today.  Return to clinic in 6 months for reevaluation.  Call sooner if concerns arise.        Hypercholesteremia    Labs ordered at visit today.  Will make recommendations based on lab results.        Relevant Orders   Lipid Profile     Follow up plan: Return in about 6 months (around 01/22/2023) for Physical and Fasting labs.

## 2022-07-24 ENCOUNTER — Ambulatory Visit: Payer: Managed Care, Other (non HMO) | Admitting: Nurse Practitioner

## 2022-07-24 ENCOUNTER — Encounter: Payer: Self-pay | Admitting: Nurse Practitioner

## 2022-07-24 VITALS — BP 131/74 | HR 68 | Temp 99.0°F | Wt 257.2 lb

## 2022-07-24 DIAGNOSIS — E7800 Pure hypercholesterolemia, unspecified: Secondary | ICD-10-CM | POA: Diagnosis not present

## 2022-07-24 DIAGNOSIS — F418 Other specified anxiety disorders: Secondary | ICD-10-CM

## 2022-07-24 DIAGNOSIS — I10 Essential (primary) hypertension: Secondary | ICD-10-CM

## 2022-07-24 DIAGNOSIS — O99345 Other mental disorders complicating the puerperium: Secondary | ICD-10-CM

## 2022-07-24 NOTE — Assessment & Plan Note (Signed)
Chronic.  Controlled.  Continue with current medication regimen of Zoloft 25mg .  Discussed how to properly wean off medication.  Okay to continue if she chooses.  Labs ordered today.  Return to clinic in 6 months for reevaluation.  Call sooner if concerns arise.

## 2022-07-24 NOTE — Assessment & Plan Note (Signed)
Labs ordered at visit today.  Will make recommendations based on lab results.   

## 2022-07-24 NOTE — Assessment & Plan Note (Signed)
Chronic.  Controlled.  Continue with current medication regimen of Labetalol 400mg  BID.  Labs ordered today.  Return to clinic in 6 months for reevaluation.  Call sooner if concerns arise.

## 2022-07-25 LAB — COMPREHENSIVE METABOLIC PANEL
ALT: 18 IU/L (ref 0–32)
AST: 16 IU/L (ref 0–40)
Albumin/Globulin Ratio: 1.6 (ref 1.2–2.2)
Albumin: 4.2 g/dL (ref 3.9–4.9)
Alkaline Phosphatase: 80 IU/L (ref 44–121)
BUN/Creatinine Ratio: 17 (ref 9–23)
BUN: 11 mg/dL (ref 6–20)
Bilirubin Total: <0.2 mg/dL (ref 0.0–1.2)
CO2: 20 mmol/L (ref 20–29)
Calcium: 9.4 mg/dL (ref 8.7–10.2)
Chloride: 102 mmol/L (ref 96–106)
Creatinine, Ser: 0.65 mg/dL (ref 0.57–1.00)
Globulin, Total: 2.6 g/dL (ref 1.5–4.5)
Glucose: 95 mg/dL (ref 70–99)
Potassium: 4.3 mmol/L (ref 3.5–5.2)
Sodium: 139 mmol/L (ref 134–144)
Total Protein: 6.8 g/dL (ref 6.0–8.5)
eGFR: 119 mL/min/{1.73_m2} (ref >59)

## 2022-07-25 LAB — LIPID PANEL
Chol/HDL Ratio: 5.3 ratio — ABNORMAL HIGH (ref 0.0–4.4)
Cholesterol, Total: 174 mg/dL (ref 100–199)
HDL: 33 mg/dL — ABNORMAL LOW (ref >39)
LDL Chol Calc (NIH): 117 mg/dL — ABNORMAL HIGH (ref 0–99)
Triglycerides: 132 mg/dL (ref 0–149)
VLDL Cholesterol Cal: 24 mg/dL (ref 5–40)

## 2022-07-25 NOTE — Progress Notes (Signed)
Hi Casey Richardson. It was nice to see you yesterday.  Your lab work looks good.  Your cholesterol improved a good amount from prior.  Keep up the good work.  No concerns at this time. Continue with your current medication regimen.  Follow up as discussed.  Please let me know if you have any questions.

## 2022-10-09 ENCOUNTER — Other Ambulatory Visit: Payer: Self-pay | Admitting: Nurse Practitioner

## 2022-10-10 ENCOUNTER — Encounter: Payer: Self-pay | Admitting: Obstetrics and Gynecology

## 2022-10-10 NOTE — Telephone Encounter (Signed)
Requested Prescriptions  Pending Prescriptions Disp Refills   labetalol (NORMODYNE) 200 MG tablet [Pharmacy Med Name: LABETALOL TABS 200MG ] 360 tablet 0    Sig: TAKE 2 TABLETS TWICE A DAY     Cardiovascular:  Beta Blockers Passed - 10/09/2022  1:08 AM      Passed - Last BP in normal range    BP Readings from Last 1 Encounters:  07/24/22 131/74         Passed - Last Heart Rate in normal range    Pulse Readings from Last 1 Encounters:  07/24/22 68         Passed - Valid encounter within last 6 months    Recent Outpatient Visits           2 months ago Essential hypertension   [facility] Larae Grooms, NP   8 months ago Annual physical exam   [place] [facility] Larae Grooms, NP   1 year ago Essential hypertension   [place] [facility] Larae Grooms, NP   1 year ago Essential hypertension   [place] [facility] Larae Grooms, NP   1 year ago Need for influenza vaccination   [place] [facility] Larae Grooms, NP       Future Appointments             In 3 months Mecum, Oswaldo Conroy, PA-C [place] [facility]             sertraline (ZOLOFT) 25 MG tablet [Pharmacy Med Name: SERTRALINE HCL TABS 25MG ] 90 tablet 0    Sig: TAKE 1 TABLET DAILY     Psychiatry:  Antidepressants - SSRI - sertraline Passed - 10/09/2022  1:08 AM      Passed - AST in normal range and within 360 days    AST  Date Value Ref Range Status  07/24/2022 16 0 - 40 IU/L Final         Passed - ALT in normal range and within 360 days    ALT  Date Value Ref Range Status  07/24/2022 18 0 - 32 IU/L Final         Passed - Completed PHQ-2 or PHQ-9 in the last 360 days      Passed - Valid encounter within last 6 months    Recent Outpatient Visits           2 months ago Essential hypertension   [place] [facility] Larae Grooms, NP   8 months ago Annual  physical exam   [place] [facility] Larae Grooms, NP   1 year ago Essential hypertension   [place] [facility] Larae Grooms, NP   1 year ago Essential hypertension   [place] [facility] Larae Grooms, NP   1 year ago Need for influenza vaccination   [place] [facility] Larae Grooms, NP       Future Appointments             In 3 months Mecum, Oswaldo Conroy, PA-C  [facility]

## 2023-01-05 ENCOUNTER — Other Ambulatory Visit: Payer: Self-pay

## 2023-01-05 ENCOUNTER — Emergency Department: Payer: Managed Care, Other (non HMO)

## 2023-01-05 ENCOUNTER — Emergency Department
Admission: EM | Admit: 2023-01-05 | Discharge: 2023-01-05 | Disposition: A | Discharge status: Home / Self Care | Payer: Managed Care, Other (non HMO) | Attending: Emergency Medicine | Admitting: Emergency Medicine

## 2023-01-05 DIAGNOSIS — I10 Essential (primary) hypertension: Secondary | ICD-10-CM | POA: Insufficient documentation

## 2023-01-05 DIAGNOSIS — O208 Other hemorrhage in early pregnancy: Secondary | ICD-10-CM | POA: Diagnosis present

## 2023-01-05 DIAGNOSIS — Z3A12 12 weeks gestation of pregnancy: Secondary | ICD-10-CM | POA: Insufficient documentation

## 2023-01-05 DIAGNOSIS — O4690 Antepartum hemorrhage, unspecified, unspecified trimester: Secondary | ICD-10-CM

## 2023-01-05 LAB — BASIC METABOLIC PANEL
Anion gap: 9 (ref 5–15)
BUN: 9 mg/dL (ref 6–20)
CO2: 20 mmol/L — ABNORMAL LOW (ref 22–32)
Calcium: 9.1 mg/dL (ref 8.9–10.3)
Chloride: 105 mmol/L (ref 98–111)
Creatinine, Ser: 0.5 mg/dL (ref 0.44–1.00)
GFR, Estimated: >60 mL/min (ref >60)
Glucose, Bld: 113 mg/dL — ABNORMAL HIGH (ref 70–99)
Potassium: 3.8 mmol/L (ref 3.5–5.1)
Sodium: 134 mmol/L — ABNORMAL LOW (ref 135–145)

## 2023-01-05 LAB — CBC
HCT: 36.8 % (ref 36.0–46.0)
Hemoglobin: 12.2 g/dL (ref 12.0–15.0)
MCH: 27.2 pg (ref 26.0–34.0)
MCHC: 33.2 g/dL (ref 30.0–36.0)
MCV: 82.1 fL (ref 80.0–100.0)
Platelets: 250 10*3/uL (ref 150–400)
RBC: 4.48 MIL/uL (ref 3.87–5.11)
RDW: 13.4 % (ref 11.5–15.5)
WBC: 6.6 10*3/uL (ref 4.0–10.5)
nRBC: 0 % (ref 0.0–0.2)

## 2023-01-05 LAB — HCG, QUANTITATIVE, PREGNANCY: hCG, Beta Chain, Quant, S: 58811 m[IU]/mL — ABNORMAL HIGH (ref <5)

## 2023-01-05 LAB — ABO/RH: ABO/RH(D): AB POS

## 2023-01-05 NOTE — ED Notes (Signed)
Pt in NAD. Denies bleeding, cramping or nausea now other than baseline for 1st trimester per pt. Pt states 1 episode of bright red blood over 2-3 minutes then it turned brown in color and stopped. Visitor at bedside.

## 2023-01-05 NOTE — ED Provider Notes (Signed)
[facility] Provider Note    Event Date/Time   First MD Initiated Contact with Patient 01/05/23 1211     (approximate)   History   Vaginal Bleeding   HPI  Casey Richardson is a 32 y.o. female   presents to the ED with complaint of some light vaginal bleeding that occurred at approximately 2 AM this morning when she "to urinate.  Patient states that it was never heavy enough that required a pad.  Currently she denies any active bleeding or cramping.  Patient had a OB appointment on 12/11/2022 in [place].  She states that her OB advised her to come to the emergency department.  Patient has a history of hypertension.  No prior problems with this pregnancy.      Physical Exam   Triage Vital Signs: ED Triage Vitals  Enc Vitals Group     BP 01/05/23 0911 (!) 143/89     Pulse Rate 01/05/23 0911 78     Resp 01/05/23 0911 18     Temp 01/05/23 0911 98.2 F (36.8 C)     Temp src --      SpO2 01/05/23 0911 94 %     Weight 01/05/23 0912 250 lb (113.4 kg)     Height 01/05/23 0912 5\' 7"  (1.702 m)     Head Circumference --      Peak Flow --      Pain Score 01/05/23 0912 0     Pain Loc --      Pain Edu? --      Excl. in GC? --     Most recent vital signs: Vitals:   01/05/23 1252 01/05/23 1253  BP:    Pulse: 68   Resp:  17  Temp:    SpO2: 98%      General: Awake, no distress.  CV:  Good peripheral perfusion.  Heart regular rate and rhythm. Resp:  Normal effort.  Clear bilaterally. Abd:  No distention.  Soft, nontender. Other:     ED Results / Procedures / Treatments   Labs (all labs ordered are listed, but only abnormal results are displayed) Labs Reviewed  BASIC METABOLIC PANEL - Abnormal; Notable for the following components:      Result Value   Sodium 134 (*)    CO2 20 (*)    Glucose, Bld 113 (*)    All other components within normal limits  HCG, QUANTITATIVE, PREGNANCY - Abnormal; Notable for the following components:   hCG, Beta  Chain, Quant, S [id number] (*)    All other components within normal limits  CBC  ABO/RH      RADIOLOGY  Ultrasound OB less than 14 weeks per radiologist showed a single IUP of 12 weeks and 3 days with a heart rate of 162.  Subchorionic hemorrhage measuring 4.9 x 5.0 x 0.8 cm.  Recommend follow-up.   PROCEDURES:  Critical Care performed:   Procedures   MEDICATIONS ORDERED IN ED: Medications - No data to display   IMPRESSION / MDM / ASSESSMENT AND PLAN / ED COURSE  I reviewed the triage vital signs and the nursing notes.   Differential diagnosis includes, but is not limited to, threatened abortion, subchorionic hemorrhage, vaginal bleeding first trimester.  32 year old female presents to the ED with complaint of vaginal bleeding that occurred at 2 AM which has resolved at this time.  Patient denies any vaginal or abdominal pain.  Beta-hCG shows [phone] with a be positive.  CBC and BMP unremarkable.  Ultrasound showed a subchorionic hemorrhage with a single IUP of 12 weeks 3 days and a heart rate of 162.  Patient was reassured and made aware that she does need to follow-up with her OB/GYN in [place].  Patient voices agreement.  She is to return to the emergency department if any severe worsening of her symptoms over the holiday weekend.      Patient's presentation is most consistent with acute presentation with potential threat to life or bodily function.  FINAL CLINICAL IMPRESSION(S) / ED DIAGNOSES   Final diagnoses:  Vaginal bleeding in pregnancy  Subchorionic hemorrhage in first trimester     Rx / DC Orders   ED Discharge Orders     None        Note:  This document was prepared using Dragon voice recognition software and may include unintentional dictation errors.   Tommi Rumps, PA-C 01/05/23 1503    Jene Every, MD 01/05/23 [phone]

## 2023-01-05 NOTE — ED Triage Notes (Signed)
Pt to ED for light vaginal bleeding last night. Pt [redacted] weeks pregnant. Denies bleeding currently. Sent from OB.  Had OB appt on 6/10

## 2023-01-05 NOTE — Discharge Instructions (Addendum)
Call make a follow-up appointment with your OB/GYN in [place].

## 2023-01-05 NOTE — ED Notes (Signed)
Pt back to room from imaging.

## 2023-01-08 ENCOUNTER — Other Ambulatory Visit: Payer: Self-pay | Admitting: Nurse Practitioner

## 2023-01-08 NOTE — Telephone Encounter (Signed)
Requested Prescriptions  Pending Prescriptions Disp Refills   sertraline (ZOLOFT) 25 MG tablet [Pharmacy Med Name: SERTRALINE HCL TABS 25MG ] 90 tablet 0    Sig: TAKE 1 TABLET DAILY     Psychiatry:  Antidepressants - SSRI - sertraline Passed - 01/08/2023  1:53 AM      Passed - AST in normal range and within 360 days    AST  Date Value Ref Range Status  07/24/2022 16 0 - 40 IU/L Final         Passed - ALT in normal range and within 360 days    ALT  Date Value Ref Range Status  07/24/2022 18 0 - 32 IU/L Final         Passed - Completed PHQ-2 or PHQ-9 in the last 360 days      Passed - Valid encounter within last 6 months    Recent Outpatient Visits           5 months ago Essential hypertension   [place] [facility] Larae Grooms, NP   11 months ago Annual physical exam   [place] [facility] Larae Grooms, NP   1 year ago Essential hypertension   [place] [facility] Larae Grooms, NP   1 year ago Essential hypertension   [place] [facility] Larae Grooms, NP   1 year ago Need for influenza vaccination   [place] [facility] Larae Grooms, NP       Future Appointments             In 2 weeks Mecum, Oswaldo Conroy, PA-C [place] [facility]             labetalol (NORMODYNE) 200 MG tablet [Pharmacy Med Name: LABETALOL TABS 200MG ] 360 tablet 0    Sig: TAKE 2 TABLETS TWICE A DAY     Cardiovascular:  Beta Blockers Passed - 01/08/2023  1:53 AM      Passed - Last BP in normal range    BP Readings from Last 1 Encounters:  01/05/23 127/80         Passed - Last Heart Rate in normal range    Pulse Readings from Last 1 Encounters:  01/05/23 68         Passed - Valid encounter within last 6 months    Recent Outpatient Visits           5 months ago Essential hypertension   [place] [facility] Larae Grooms, NP   11 months ago Annual  physical exam   [place] [facility] Larae Grooms, NP   1 year ago Essential hypertension   [place] [facility] Larae Grooms, NP   1 year ago Essential hypertension   [place] [facility] Larae Grooms, NP   1 year ago Need for influenza vaccination   [place] [facility] Larae Grooms, NP       Future Appointments             In 2 weeks Mecum, Oswaldo Conroy, PA-C  [facility], [facility]

## 2023-01-10 ENCOUNTER — Telehealth: Payer: Self-pay

## 2023-01-10 NOTE — Telephone Encounter (Signed)
-----   Message from Pablo Ledger, CMA sent at 01/08/2023  9:37 AM EDT ----- Patient needs TOC call completed please.

## 2023-01-10 NOTE — Transitions of Care (Post Inpatient/ED Visit) (Signed)
   01/10/2023  Name: Casey Richardson MRN: [id number] DOB: 01-17-1991  Today's TOC FU Call Status: Today's TOC FU Call Status:: Successful TOC FU Call Competed TOC FU Call Complete Date: 01/10/23  Transition Care Management Follow-up Telephone Call Date of Discharge: 01/05/23 Discharge Facility[facility]) Type of Discharge: Emergency Department Reason for ED Visit: Other: How have you been since you were released from the hospital?: Better Any questions or concerns?: No  Items Reviewed: Did you receive and understand the discharge instructions provided?: Yes Medications obtained,verified, and reconciled?: No Medications Not Reviewed Reasons:: Other: Any new allergies since your discharge?: No Dietary orders reviewed?: NA Do you have support at home?: No  Medications Reviewed Today: Medications Reviewed Today     Reviewed by Larae Grooms, NP (Nurse Practitioner) on 07/24/22 at 0920  Med List Status: <None>   Medication Order Taking? Sig Documenting Provider Last Dose Status Informant  labetalol (NORMODYNE) 200 MG tablet [id number] Yes Take 2 tablets (400 mg total) by mouth 2 (two) times daily. Larae Grooms, NP Taking Active   Prenatal Vit-Fe Fumarate-FA (PRENATAL VITAMINS PO) [id number] Yes Take by mouth daily. [provider] Taking Active Self  sertraline (ZOLOFT) 25 MG tablet [id number] Yes TAKE 1 TABLET DAILY Larae Grooms, NP Taking Active             Home Care and Equipment/Supplies: Were Home Health Services Ordered?: NA Any new equipment or medical supplies ordered?: NA  Functional Questionnaire: Do you need assistance with bathing/showering or dressing?: No Do you need assistance with meal preparation?: No Do you need assistance with eating?: No Do you have difficulty maintaining continence: No Do you need assistance with getting out of bed/getting out of a chair/moving?: No Do you have difficulty managing or  taking your medications?: No  Follow up appointments reviewed: PCP Follow-up appointment confirmed?: NA Specialist Hospital Follow-up appointment confirmed?: Yes Do you need transportation to your follow-up appointment?: No Do you understand care options if your condition(s) worsen?: Yes-patient verbalized understanding    SIGNATURE Malen Gauze, CMA

## 2023-01-22 ENCOUNTER — Encounter: Payer: Managed Care, Other (non HMO) | Admitting: Physician Assistant

## 2023-02-15 ENCOUNTER — Ambulatory Visit: Payer: Managed Care, Other (non HMO) | Admitting: Physician Assistant

## 2023-02-15 ENCOUNTER — Encounter: Payer: Self-pay | Admitting: Physician Assistant

## 2023-02-15 VITALS — BP 106/64 | HR 80 | Ht 67.0 in | Wt 256.8 lb

## 2023-02-15 DIAGNOSIS — J069 Acute upper respiratory infection, unspecified: Secondary | ICD-10-CM

## 2023-02-15 DIAGNOSIS — J029 Acute pharyngitis, unspecified: Secondary | ICD-10-CM | POA: Diagnosis not present

## 2023-02-15 NOTE — Patient Instructions (Signed)
You can use Regular formulations of Mucinex, Robitussin, Zyrtec and Flonase nasal spray.

## 2023-02-15 NOTE — Progress Notes (Signed)
Acute Office Visit   Patient: Casey Richardson   DOB: July 10, 1990   32 y.o. Female  MRN: [id number] Visit Date: 02/15/2023  Today's healthcare provider: Oswaldo Conroy Tayjah Lobdell, PA-C  Introduced myself to the patient as a Secondary school teacher and provided education on APPs in clinical practice.    Chief Complaint  Patient presents with   Cough    Patient says she became symptomatic on Tuesday. Patient says it is worse at night than the during the day. Patient says she is currently 18 weeks and is scheduled to have anatomy scan on Monday. Patient declines having any over the counter medications.    Sore Throat   Ear Pain   Subjective    HPI HPI     Cough    Additional comments: Patient says she became symptomatic on Tuesday. Patient says it is worse at night than the during the day. Patient says she is currently 18 weeks and is scheduled to have anatomy scan on Monday. Patient declines having any over the counter medications.       Last edited by Malen Gauze, CMA on 02/15/2023  9:00 AM.      She reports she has had some congestion, sore throat and cough She has her anatomy scan on Monday so she wanted to get checked out and does not want to reschedule She thinks her symptoms started on Tuesday  Interventions: She has not been taking anything for her symptoms   She does report her 32 yo has allergy symptoms but this resolves with Zyrtec daily  She denies recent travel  She is going to get COVID test once she leaves apt     Medications: Outpatient Medications Prior to Visit  Medication Sig   aspirin EC 81 MG tablet Take 81 mg by mouth daily. Swallow whole.   GARLIC OIL PO Take by mouth.   labetalol (NORMODYNE) 200 MG tablet TAKE 2 TABLETS TWICE A DAY   Multiple Vitamin (MULTI-VITAMIN DAILY PO) Take by mouth.   Prenatal Vit-Fe Fumarate-FA (PRENATAL VITAMINS PO) Take by mouth daily.   sertraline (ZOLOFT) 25 MG tablet TAKE 1 TABLET DAILY   Drospirenone (SLYND) 4 MG TABS Take 1 tablet  by mouth daily. (Patient not taking: Reported on 02/15/2023)   fluticasone (FLONASE) 50 MCG/ACT nasal spray Place into the nose.   No facility-administered medications prior to visit.    Review of Systems  Constitutional:  Negative for chills, fatigue and fever.  HENT:  Positive for congestion, rhinorrhea and sore throat. Negative for ear pain, postnasal drip, sinus pressure and sinus pain.   Respiratory:  Positive for cough. Negative for shortness of breath and wheezing.   Gastrointestinal:  Negative for diarrhea, nausea and vomiting.  Neurological:  Negative for dizziness, light-headedness and headaches.        Objective    BP 106/64   Pulse 80   Ht 5\' 7"  (1.702 m)   Wt 256 lb 12.8 oz (116.5 kg)   SpO2 97%   BMI 40.22 kg/m     Physical Exam Vitals reviewed.  Constitutional:      General: She is awake.     Appearance: Normal appearance. She is well-developed and well-groomed.  HENT:     Head: Normocephalic and atraumatic.     Mouth/Throat:     Lips: Pink.     Mouth: Mucous membranes are moist.     Pharynx: No pharyngeal swelling, oropharyngeal exudate, posterior oropharyngeal erythema, uvula swelling or postnasal  drip.     Tonsils: No tonsillar exudate or tonsillar abscesses.  Cardiovascular:     Rate and Rhythm: Normal rate and regular rhythm.     Pulses: Normal pulses.          Radial pulses are 2+ on the right side and 2+ on the left side.     Heart sounds: Normal heart sounds. No murmur heard.    No friction rub. No gallop.  Pulmonary:     Effort: Pulmonary effort is normal.     Breath sounds: Normal breath sounds. No decreased air movement. No decreased breath sounds, wheezing, rhonchi or rales.  Musculoskeletal:     Cervical back: Normal range of motion.  Lymphadenopathy:     Head:     Right side of head: No submental, submandibular or preauricular adenopathy.     Left side of head: No submental, submandibular or preauricular adenopathy.     Cervical:      Right cervical: No superficial or posterior cervical adenopathy.    Left cervical: No superficial or posterior cervical adenopathy.     Upper Body:     Right upper body: No supraclavicular adenopathy.     Left upper body: No supraclavicular adenopathy.  Skin:    General: Skin is warm and dry.  Neurological:     Mental Status: She is alert.  Psychiatric:        Behavior: Behavior is cooperative.       No results found for any visits on 02/15/23.  Assessment & Plan      No follow-ups on file.      Problem List Items Addressed This Visit   None Visit Diagnoses     Viral upper respiratory tract infection    -  Primary Visit with patient indicates symptoms comprised of sore throat, cough, nasal congestion since Tuesday congruent with acute URI that is likely viral in nature  Patient is currently pregnant  She will test for COVID at home and let us know  Due to nature and duration of symptoms recommended treatment regimen is symptomatic relief and follow up if needed Discussed with patient the various viral and bacterial etiologies of current illness and appropriate course of treatment Rapid strep was negative, will send for culture for definitive rule out. Results reviewed with patient in office.  Recommend using regular formulations of Robitussin, mucinex, tylenol and Flonase nasal spray for now  Discussed return precautions if symptoms are not improving or worsen over next 5-7 days.     Sore throat       Relevant Orders   Rapid Strep screen([facility])        No follow-ups on file.   I, Isac Lincks E Mazy Culton, PA-C, have reviewed all documentation for this visit. The documentation on 02/15/23 for the exam, diagnosis, procedures, and orders are all accurate and complete.   Jacquelin Hawking, MHS, PA-C [facility] [address] [facility]

## 2023-02-16 ENCOUNTER — Ambulatory Visit: Payer: Self-pay | Admitting: *Deleted

## 2023-02-16 NOTE — Telephone Encounter (Signed)
Based on her symptoms, it is likely that she has a viral URI. Management for this is symptomatic treatment as discussed at recent apt. If symptoms fail to improve after 5-7 days please schedule an office visit for further evaluation.

## 2023-02-16 NOTE — Telephone Encounter (Signed)
  Chief Complaint: sinus pain worsening , requesting medication/recommendations, last OV yesterday for same sx hx [redacted] weeks pregnant Symptoms: nasal congestion blowing nose for clear to light green drainage, sore throat, cough productive at times. Upper teeth pain, ear pain.  Frequency: 4 days  Pertinent Negatives: Patient denies fever Disposition: [] ED /[] Urgent Care (no appt availability in office) / [] Appointment(In office/virtual)/ []  [place] [facility]/ [] Home Care/ [] Refused Recommended Disposition /[] [facility]/ [x]  Follow-up with PCP Additional Notes:   Patient seen in OV yesterday . Requesting medication for sx / recommendations.  Patient requesting a call back and please send any med Rx to [facility] in [place].   Summary: Question Sinus   Congestion, sore throat, covid negative. Teeth pain, ear pain  ( 4 days) . [redacted] weeks pregnant              Reason for Disposition  Earache  Answer Assessment - Initial Assessment Questions 1. LOCATION: "Where does it hurt?"      Upper teeth and left side of face  2. ONSET: "When did the sinus pain start?"  (e.g., hours, days)      4 days ago  3. SEVERITY: "How bad is the pain?"   (Scale 1-10; mild, moderate or severe)   - MILD (1-3): doesn't interfere with normal activities    - MODERATE (4-7): interferes with normal activities (e.g., work or school) or awakens from sleep   - SEVERE (8-10): excruciating pain and patient unable to do any normal activities        Difficulty sleeping last night  4. RECURRENT SYMPTOM: "Have you ever had sinus problems before?" If Yes, ask: "When was the last time?" and "What happened that time?"      Yes last OV for sx yesterday  5. NASAL CONGESTION: "Is the nose blocked?" If Yes, ask: "Can you open it or must you breathe through your mouth?"     Left side blocked at times 6. NASAL DISCHARGE: "Do you have discharge from your nose?" If so ask, "What color?"     Yes clear to light  green  7. FEVER: "Do you have a fever?" If Yes, ask: "What is it, how was it measured, and when did it start?"      Na  8. OTHER SYMPTOMS: "Do you have any other symptoms?" (e.g., sore throat, cough, earache, difficulty breathing)     Sinus pain, sore throat , teeth pain ear pain cough productive clear 9. PREGNANCY: "Is there any chance you are pregnant?" "When was your last menstrual period?"     [redacted] weeks pregnant  Protocols used: Sinus Pain or Congestion-A-AH

## 2023-02-16 NOTE — Telephone Encounter (Signed)
Reached out to patient via MyChart to make her aware of Erin Mecum, PA recommendations. Advised to give our office a call back or send [place] a message.

## 2023-02-18 LAB — RAPID STREP SCREEN (MED CTR MEBANE ONLY): Strep Gp A Ag, IA W/Reflex: NEGATIVE

## 2023-02-18 LAB — CULTURE, GROUP A STREP

## 2023-02-19 NOTE — Progress Notes (Signed)
Your Strep culture came back negative for the bacterial responsible for causing strep pharyngitis. It was positive for a different strain of bacteria that is common in the throat and overgrowth of this bacteria can sometimes cause discomfort. I see you went to UC and were started on Amoxicillin. This would provide coverage for strep pharyngitis and often helps get the other bacteria that was found, back under control. No further changes are indicated at this time.

## 2023-04-09 ENCOUNTER — Other Ambulatory Visit: Payer: Self-pay | Admitting: Nurse Practitioner

## 2023-07-09 ENCOUNTER — Other Ambulatory Visit: Payer: Self-pay | Admitting: Nurse Practitioner

## 2023-07-10 NOTE — Telephone Encounter (Signed)
 Requested medication (s) are due for refill today: Yes  Requested medication (s) are on the active medication list: Yes  Last refill:  04/09/23  Future visit scheduled: No  Notes to clinic:  Unable to refill per protocol, appointment needed.      Requested Prescriptions  Pending Prescriptions Disp Refills   sertraline  (ZOLOFT ) 25 MG tablet [Pharmacy Med Name: SERTRALINE  HCL TABS 25MG ] 90 tablet 3    Sig: TAKE 1 TABLET DAILY     Psychiatry:  Antidepressants - SSRI - sertraline  Passed - 07/10/2023  7:09 PM      Passed - AST in normal range and within 360 days    AST  Date Value Ref Range Status  07/24/2022 16 0 - 40 IU/L Final         Passed - ALT in normal range and within 360 days    ALT  Date Value Ref Range Status  07/24/2022 18 0 - 32 IU/L Final         Passed - Completed PHQ-2 or PHQ-9 in the last 360 days      Passed - Valid encounter within last 6 months    Recent Outpatient Visits           4 months ago Viral upper respiratory tract infection   [facility] Mecum, Rocky BRAVO, PA-C   11 months ago Essential hypertension   [facility] Melvin Pao, NP   1 year ago Annual physical exam   [place] Melvin Pao, NP   1 year ago Essential hypertension   [place] [facility] Melvin Pao, NP   2 years ago Essential hypertension   [place] Melvin Pao, NP               labetalol  (NORMODYNE ) 200 MG tablet [Pharmacy Med Name: LABETALOL  TABS 200MG ] 360 tablet 3    Sig: TAKE 2 TABLETS TWICE A DAY     Cardiovascular:  Beta Blockers Passed - 07/10/2023  7:09 PM      Passed - Last BP in normal range    BP Readings from Last 1 Encounters:  02/15/23 106/64         Passed - Last Heart Rate in normal range    Pulse Readings from Last 1 Encounters:  02/15/23 80         Passed - Valid encounter within last 6 months    Recent  Outpatient Visits           4 months ago Viral upper respiratory tract infection   [place] [facility] Mecum, Rocky BRAVO, PA-C   11 months ago Essential hypertension   [place] [facility] Melvin Pao, NP   1 year ago Annual physical exam   [facility] [facility] Melvin Pao, NP   1 year ago Essential hypertension   [facility] Melvin Pao, NP   2 years ago Essential hypertension    [facility] Melvin Pao, NP

## 2023-07-20 ENCOUNTER — Telehealth: Payer: Self-pay | Admitting: Nurse Practitioner

## 2023-07-20 ENCOUNTER — Encounter: Payer: Self-pay | Admitting: Pediatrics

## 2023-07-20 ENCOUNTER — Ambulatory Visit: Payer: Managed Care, Other (non HMO) | Admitting: Pediatrics

## 2023-07-20 VITALS — BP 145/81 | HR 72 | Ht 67.0 in | Wt 253.6 lb

## 2023-07-20 DIAGNOSIS — O1495 Unspecified pre-eclampsia, complicating the puerperium: Secondary | ICD-10-CM | POA: Diagnosis not present

## 2023-07-20 DIAGNOSIS — Z1330 Encounter for screening examination for mental health and behavioral disorders, unspecified: Secondary | ICD-10-CM

## 2023-07-20 LAB — CBC
Hematocrit: 38.8 % (ref 34.0–46.6)
Hemoglobin: 12.9 g/dL (ref 11.1–15.9)
MCH: 27.6 pg (ref 26.6–33.0)
MCHC: 33.2 g/dL (ref 31.5–35.7)
MCV: 83 fL (ref 79–97)
Platelets: 366 10*3/uL (ref 150–450)
RBC: 4.67 x10E6/uL (ref 3.77–5.28)
RDW: 14.8 % (ref 11.7–15.4)
WBC: 8.2 10*3/uL (ref 3.4–10.8)

## 2023-07-20 MED ORDER — NIFEDIPINE ER OSMOTIC RELEASE 30 MG PO TB24: 30.0000 mg | ORAL_TABLET | Freq: Every day | ORAL | 0 refills | Status: DC

## 2023-07-20 NOTE — Progress Notes (Signed)
Office Visit  BP (!) 145/81 (BP Location: Left Arm, Cuff Size: Large)   Pulse 72   Ht 5\' 7"  (1.702 m)   Wt 253 lb 9.6 oz (115 kg)   SpO2 99%   BMI 39.72 kg/m    Subjective:    Patient ID: Casey Richardson, female    DOB: 1991-06-05, 33 y.o.   MRN: [id number]  HPI: Casey Richardson is a 33 y.o. female  Chief Complaint  Patient presents with   Blood Pressure Check   Hypertension    Discussed the use of AI scribe software for clinical note transcription with the patient, who gave verbal consent to proceed.  History of Present Illness   The patient, a recent postpartum individual, was diagnosed with postpartum preeclampsia 24 hours after childbirth, which occurred 12 days prior to the current consultation. She was hospitalized for five days and initially managed on 800mg  of Labetalol three times a day and 60mg  of Procardia XL.  Upon discharge, the patient experienced a significant drop in blood pressure, with readings around 110/60, accompanied by lightheadedness. This led to the discontinuation of Procardia and a reduction in the Labetalol dosage. However, this adjustment resulted in a rebound increase in blood pressure, with readings frequently in the 150s and 160s, and occasional readings in the 170s.  The patient's home blood pressure monitor consistently showed higher readings than those taken in the hospital, raising concerns about its reliability. Despite the elevated blood pressure readings, the patient reported no symptoms of preeclampsia such as headache, chest pain, shortness of breath, or swelling.  The patient has been managing her blood pressure with 800mg  of Labetalol three times a day for the past five days, without Procardia. She has been in regular contact with her OB for blood pressure management and has a scheduled call later in the day of the consultation. The patient expressed concern about the high blood pressure readings, but understands that her situation  is not typical due to the preeclampsia.     Relevant past medical, surgical, family and social history reviewed and updated as indicated. Interim medical history since our last visit reviewed. Allergies and medications reviewed and updated.  ROS per HPI unless specifically indicated above     Objective:    BP (!) 145/81 (BP Location: Left Arm, Cuff Size: Large)   Pulse 72   Ht 5\' 7"  (1.702 m)   Wt 253 lb 9.6 oz (115 kg)   SpO2 99%   BMI 39.72 kg/m   Wt Readings from Last 3 Encounters:  07/20/23 253 lb 9.6 oz (115 kg)  02/15/23 256 lb 12.8 oz (116.5 kg)  01/05/23 250 lb (113.4 kg)     Physical Exam Constitutional:      Appearance: Normal appearance.  HENT:     Head: Normocephalic and atraumatic.  Eyes:     Pupils: Pupils are equal, round, and reactive to light.  Cardiovascular:     Rate and Rhythm: Normal rate and regular rhythm.     Pulses: Normal pulses.     Heart sounds: Normal heart sounds.  Pulmonary:     Effort: Pulmonary effort is normal.     Breath sounds: Normal breath sounds.  Musculoskeletal:        General: Normal range of motion.     Cervical back: Normal range of motion.  Skin:    General: Skin is warm and dry.     Capillary Refill: Capillary refill takes less than 2 seconds.  Neurological:  General: No focal deficit present.     Mental Status: She is alert. Mental status is at baseline.  Psychiatric:        Mood and Affect: Mood normal.        Behavior: Behavior normal.         07/20/2023    2:56 PM 02/15/2023    9:06 AM 07/24/2022    9:06 AM 01/20/2022    8:27 AM 09/19/2021   10:40 AM  Depression screen PHQ 2/9  Decreased Interest 0 0 0 0 0  Down, Depressed, Hopeless 1 0 0 0 0  PHQ - 2 Score 1 0 0 0 0  Altered sleeping 0 1 0 0 0  Tired, decreased energy 1 1 0 1 0  Change in appetite 0 0 0 1 0  Feeling bad or failure about yourself  0 0 0 0 0  Trouble concentrating 0 0 0 0 0  Moving slowly or fidgety/restless 0 0 0 0 0  Suicidal  thoughts 0 0 0 0 0  PHQ-9 Score 2 2 0 2 0  Difficult doing work/chores  Not difficult at all Not difficult at all Not difficult at all Not difficult at all       07/20/2023    2:56 PM 02/15/2023    9:06 AM 07/24/2022    9:07 AM 01/20/2022    8:28 AM  GAD 7 : Generalized Anxiety Score  Nervous, Anxious, on Edge 0 0 0 1  Control/stop worrying 1 0 0 0  Worry too much - different things 0 0 0 0  Trouble relaxing 1 0 0 0  Restless 0 0 0 0  Easily annoyed or irritable 0 0 0 1  Afraid - awful might happen 1 0 0 0  Total GAD 7 Score 3 0 0 2  Anxiety Difficulty  Not difficult at all Not difficult at all Not difficult at all       Assessment & Plan:  Assessment & Plan   Pre-eclampsia in postpartum period Recent diagnosis with fluctuating blood pressure readings. Initially managed with Labetalol 800mg  TID and Procardia XL 60mg  daily. Procardia was discontinued due to hypotension and Labetalol was reduced, but blood pressure increased again. Currently on Labetalol 800mg  TID for the past 5 days. Home blood pressure readings are inconsistent and possibly inaccurate. She's had severe range BP however BP normalized after resting a few minutes here. Reasonable to restart procardia at lower dose and get labs for now. Has f/u with OB later, she will follow this plan unless they otherwise indicate needs to come in to triage. -Continue Labetalol 800mg  TID. -Check CBC and CMP today. -Attempt to provide a reliable blood pressure cuff for home monitoring. -Consult with OB regarding the possibility of reintroducing Procardia at a lower dose (30mg ). -Scheduled follow-up with OB today.  -     Comprehensive metabolic panel -     CBC -     Protein / creatinine ratio, urine -     NIFEdipine ER Osmotic Release; Take 1 tablet (30 mg total) by mouth daily.  Dispense: 30 tablet; Refill: 0  Encounter for behavioral health screening As part of their intake evaluation, the patient was screened for depression,  anxiety.  PHQ9 SCORE 2, GAD7 SCORE 3. Screening results negative for tested conditions. See plan under problem/diagnosis above.  Follow up plan: Return in about 1 week (around 07/27/2023) for HTN, (virtual).  Montine Hight Howell Pringle, MD  Approximately 30 minutes spent on patient encounter today including  assessment, counseling, diagnosing, treatment plan development, and charting.

## 2023-07-20 NOTE — Telephone Encounter (Signed)
Pt called back again on status if she can come in today for a bp check, because she doesn't think hers reads accurately and she has preclampsia, so wants to make sure she has her correct bp. Please cb

## 2023-07-20 NOTE — Patient Instructions (Signed)
Restart 30mg  procardia unless otherwise indicated by OB

## 2023-07-20 NOTE — Telephone Encounter (Signed)
Copied from CRM [id number]. Topic: Appointment Scheduling - Scheduling Inquiry for Clinic >> Jul 20, 2023  8:57 AM Franchot Heidelberg wrote: Reason for CRM: Pt called requesting to have her blood pressure checked today. Pregnant

## 2023-07-20 NOTE — Telephone Encounter (Signed)
Appointment has been made

## 2023-07-21 LAB — COMPREHENSIVE METABOLIC PANEL
ALT: 33 [IU]/L — ABNORMAL HIGH (ref 0–32)
AST: 21 [IU]/L (ref 0–40)
Albumin: 4.1 g/dL (ref 3.9–4.9)
Alkaline Phosphatase: 113 [IU]/L (ref 44–121)
BUN/Creatinine Ratio: 18 (ref 9–23)
BUN: 13 mg/dL (ref 6–20)
Bilirubin Total: <0.2 mg/dL (ref 0.0–1.2)
CO2: 24 mmol/L (ref 20–29)
Calcium: 9.7 mg/dL (ref 8.7–10.2)
Chloride: 100 mmol/L (ref 96–106)
Creatinine, Ser: 0.72 mg/dL (ref 0.57–1.00)
Globulin, Total: 3 g/dL (ref 1.5–4.5)
Glucose: 85 mg/dL (ref 70–99)
Potassium: 4.6 mmol/L (ref 3.5–5.2)
Sodium: 139 mmol/L (ref 134–144)
Total Protein: 7.1 g/dL (ref 6.0–8.5)
eGFR: 112 mL/min/{1.73_m2} (ref >59)

## 2023-07-21 LAB — PROTEIN / CREATININE RATIO, URINE
Creatinine, Urine: 58.3 mg/dL
Protein, Ur: 18.9 mg/dL
Protein/Creat Ratio: 324 mg/g{creat} — ABNORMAL HIGH (ref 0–200)

## 2023-07-30 ENCOUNTER — Encounter: Payer: Self-pay | Admitting: Nurse Practitioner

## 2023-07-30 ENCOUNTER — Telehealth (INDEPENDENT_AMBULATORY_CARE_PROVIDER_SITE_OTHER): Payer: Managed Care, Other (non HMO) | Admitting: Nurse Practitioner

## 2023-07-30 VITALS — BP 115/62 | Ht 67.0 in

## 2023-07-30 DIAGNOSIS — I10 Essential (primary) hypertension: Secondary | ICD-10-CM

## 2023-07-30 NOTE — Assessment & Plan Note (Signed)
Chronic.  Better controlled at this time.  On Labetalol 800mg  TID.  She was having low blood pressures with Nifedipine and hasn't been taking it.  Has been reporting blood pressures to OB every 24 hours and they are managing her blood pressures.  Continue to follow up with OB.

## 2023-07-30 NOTE — Progress Notes (Signed)
BP 115/62 (BP Location: Left Arm, Patient Position: Sitting, Cuff Size: Normal)   Ht 5\' 7"  (1.702 m)   BMI 39.72 kg/m    Subjective:    Patient ID: Casey Richardson, female    DOB: 30-Oct-1990, 33 y.o.   MRN: [id number]  HPI: Casey Richardson is a 33 y.o. female  Chief Complaint  Patient presents with   Hypertension   HYPERTENSION without Chronic Kidney Disease Patient was not able to tolerate the Nifedipine.  Blood pressures were running 105/40.  She is taking Labetalol 800mg  TID.  She is reporting her blood pressures to her OB. Hypertension status: controlled  Satisfied with current treatment? yes Duration of hypertension: years BP monitoring frequency:  daily BP range: 120/80 BP medication side effects:  no Medication compliance: excellent compliance Previous BP meds:labetalol Aspirin: no Recurrent headaches: no Visual changes: no Palpitations: no Dyspnea: no Chest pain: no Lower extremity edema: no Dizzy/lightheaded: no  Relevant past medical, surgical, family and social history reviewed and updated as indicated. Interim medical history since our last visit reviewed. Allergies and medications reviewed and updated.  Review of Systems  Eyes:  Negative for visual disturbance.  Respiratory:  Negative for cough, chest tightness and shortness of breath.   Cardiovascular:  Negative for chest pain, palpitations and leg swelling.  Neurological:  Negative for dizziness and headaches.    Per HPI unless specifically indicated above     Objective:    BP 115/62 (BP Location: Left Arm, Patient Position: Sitting, Cuff Size: Normal)   Ht 5\' 7"  (1.702 m)   BMI 39.72 kg/m   Wt Readings from Last 3 Encounters:  07/20/23 253 lb 9.6 oz (115 kg)  02/15/23 256 lb 12.8 oz (116.5 kg)  01/05/23 250 lb (113.4 kg)    Physical Exam Vitals and nursing note reviewed.  HENT:     Head: Normocephalic.     Right Ear: Hearing normal.     Left Ear: Hearing normal.     Nose: Nose  normal.  Eyes:     Pupils: Pupils are equal, round, and reactive to light.  Pulmonary:     Effort: Pulmonary effort is normal. No respiratory distress.  Neurological:     Mental Status: She is alert.  Psychiatric:        Mood and Affect: Mood normal.        Behavior: Behavior normal.        Thought Content: Thought content normal.        Judgment: Judgment normal.     Results for orders placed or performed in visit on 07/20/23  Comp Met (CMET)   Collection Time: 07/20/23  3:31 PM  Result Value Ref Range   Glucose 85 70 - 99 mg/dL   BUN 13 6 - 20 mg/dL   Creatinine, Ser 4.09 0.57 - 1.00 mg/dL   eGFR 811 >91 YN/WGN/5.62   BUN/Creatinine Ratio 18 9 - 23   Sodium 139 134 - 144 mmol/L   Potassium 4.6 3.5 - 5.2 mmol/L   Chloride 100 96 - 106 mmol/L   CO2 24 20 - 29 mmol/L   Calcium 9.7 8.7 - 10.2 mg/dL   Total Protein 7.1 6.0 - 8.5 g/dL   Albumin 4.1 3.9 - 4.9 g/dL   Globulin, Total 3.0 1.5 - 4.5 g/dL   Bilirubin Total <1.3 0.0 - 1.2 mg/dL   Alkaline Phosphatase 113 44 - 121 IU/L   AST 21 0 - 40 IU/L   ALT 33 (H) 0 - 32  IU/L  CBC   Collection Time: 07/20/23  3:31 PM  Result Value Ref Range   WBC 8.2 3.4 - 10.8 x10E3/uL   RBC 4.67 3.77 - 5.28 x10E6/uL   Hemoglobin 12.9 11.1 - 15.9 g/dL   Hematocrit 84.6 96.2 - 46.6 %   MCV 83 79 - 97 fL   MCH 27.6 26.6 - 33.0 pg   MCHC 33.2 31.5 - 35.7 g/dL   RDW 95.2 84.1 - 32.4 %   Platelets 366 150 - 450 x10E3/uL  Protein / Creatinine Ratio, Urine   Collection Time: 07/20/23  3:31 PM  Result Value Ref Range   Creatinine, Urine 58.3 Not Estab. mg/dL   Protein, Ur 40.1 Not Estab. mg/dL   Protein/Creat Ratio 027 (H) 0 - 200 mg/g creat      Assessment & Plan:   Problem List Items Addressed This Visit       Cardiovascular and Mediastinum   Essential hypertension - Primary   Chronic.  Better controlled at this time.  On Labetalol 800mg  TID.  She was having low blood pressures with Nifedipine and hasn't been taking it.  Has been  reporting blood pressures to OB every 24 hours and they are managing her blood pressures.  Continue to follow up with OB.        Follow up plan: Return in about 3 months (around 10/28/2023) for BP Check.   This visit was completed via MyChart due to the restrictions of the COVID-19 pandemic. All issues as above were discussed and addressed. Physical exam was done as above through visual confirmation on MyChart. If it was felt that the patient should be evaluated in the office, they were directed there. The patient verbally consented to this visit. Location of the patient: Home Location of the provider: Office Those involved with this call:  Provider: Larae Grooms, NP CMA: Oswaldo Conroy, CMA Front Desk/Registration: Servando Snare This encounter was conducted via video.  I spent 20 dedicated to the care of this patient on the date of this encounter to include previsit review of symptoms, plan of care and follow up, face to face time with the patient, and post visit ordering of testing.

## 2023-08-10 ENCOUNTER — Other Ambulatory Visit: Payer: Self-pay | Admitting: Nurse Practitioner

## 2023-08-10 NOTE — Telephone Encounter (Signed)
 Medication Refill -  Most Recent Primary Care Visit:  Provider: MELVIN PAO  Department: [id number]  Visit Type: MYCHART VIDEO VISIT  Date: 07/30/2023  Medication: sertraline  (ZOLOFT ) 25 MG tablet   Has the patient contacted their pharmacy? No   Is this the correct pharmacy for this prescription? Yes If no, delete pharmacy and type the correct one.  This is the patient's preferred pharmacy:  [id number] - [place], [place] - [address] AT [facility] OF [address] [address] [place] [address] Phone: [phone] Fax: [phone]   Has the prescription been filled recently? No  Is the patient out of the medication? Yes  Has the patient been seen for an appointment in the last year OR does the patient have an upcoming appointment? Yes  Can we respond through MyChart? Yes  Agent: Please be advised that Rx refills may take up to 3 business days. We ask that you follow-up with your pharmacy.

## 2023-08-13 MED ORDER — SERTRALINE HCL 25 MG PO TABS: 25.0000 mg | ORAL_TABLET | Freq: Every day | ORAL | 0 refills | Status: DC

## 2023-08-13 NOTE — Telephone Encounter (Signed)
 Requested Prescriptions  Pending Prescriptions Disp Refills   sertraline  (ZOLOFT ) 25 MG tablet 90 tablet 0    Sig: Take 1 tablet (25 mg total) by mouth daily.     Psychiatry:  Antidepressants - SSRI - sertraline  Failed - 08/13/2023  9:56 AM      Failed - ALT in normal range and within 360 days    ALT  Date Value Ref Range Status  07/20/2023 33 (H) 0 - 32 IU/L Final         Passed - AST in normal range and within 360 days    AST  Date Value Ref Range Status  07/20/2023 21 0 - 40 IU/L Final         Passed - Completed PHQ-2 or PHQ-9 in the last 360 days      Passed - Valid encounter within last 6 months    Recent Outpatient Visits           2 weeks ago Essential hypertension   [place] [facility] Aileen Alexanders, NP   3 weeks ago Pre-eclampsia in postpartum period   [place] [facility] Hadassah Letters, MD   5 months ago Viral upper respiratory tract infection   [place] [facility] Mecum, Pearla [place], PA-C   1 year ago Essential hypertension   [place] [facility] Aileen Alexanders, NP   1 year ago Annual physical exam   [place] [facility] Aileen Alexanders, NP       Future Appointments             In 2 months Aileen Alexanders, NP [facility]

## 2023-08-17 ENCOUNTER — Other Ambulatory Visit: Payer: Self-pay | Admitting: Pediatrics

## 2023-08-17 DIAGNOSIS — O1495 Unspecified pre-eclampsia, complicating the puerperium: Secondary | ICD-10-CM

## 2023-08-17 NOTE — Telephone Encounter (Signed)
The original prescription was discontinued on 07/30/2023 by Larae Grooms, NP for the following reason: Completed Course.   Requested Prescriptions  Pending Prescriptions Disp Refills   NIFEdipine (PROCARDIA-XL/NIFEDICAL-XL) 30 MG 24 hr tablet [Pharmacy Med Name: NIFEDIPINE 30MG  ER (XL/OSM) TABLETS] 30 tablet 0    Sig: TAKE 1 TABLET(30 MG) BY MOUTH DAILY     Cardiovascular: Calcium Channel Blockers 2 Passed - 08/17/2023  4:18 PM      Passed - Last BP in normal range    BP Readings from Last 1 Encounters:  07/30/23 115/62         Passed - Last Heart Rate in normal range    Pulse Readings from Last 1 Encounters:  07/20/23 72         Passed - Valid encounter within last 6 months    Recent Outpatient Visits           2 weeks ago Essential hypertension   [place] [facility] Larae Grooms, NP   4 weeks ago Pre-eclampsia in postpartum period   [place] [facility] Jackolyn Confer, MD   6 months ago Viral upper respiratory tract infection   [place] [facility] Mecum, Oswaldo Conroy, PA-C   1 year ago Essential hypertension   [place] [facility] Larae Grooms, NP   1 year ago Annual physical exam   [place] [facility] Larae Grooms, NP       Future Appointments             In 2 months Larae Grooms, NP [place] [facility]

## 2023-10-31 ENCOUNTER — Ambulatory Visit: Payer: Self-pay | Admitting: Nurse Practitioner

## 2023-11-09 ENCOUNTER — Other Ambulatory Visit: Payer: Self-pay | Admitting: Nurse Practitioner

## 2023-11-12 NOTE — Telephone Encounter (Signed)
 Requested Prescriptions  Pending Prescriptions Disp Refills   sertraline  (ZOLOFT ) 25 MG tablet [Pharmacy Med Name: SERTRALINE  25MG  TABLETS] 90 tablet 0    Sig: TAKE 1 TABLET(25 MG) BY MOUTH DAILY     Psychiatry:  Antidepressants - SSRI - sertraline  Failed - 11/12/2023 12:00 PM      Failed - ALT in normal range and within 360 days    ALT  Date Value Ref Range Status  07/20/2023 33 (H) 0 - 32 IU/L Final         Failed - Valid encounter within last 6 months    Recent Outpatient Visits   None            Passed - AST in normal range and within 360 days    AST  Date Value Ref Range Status  07/20/2023 21 0 - 40 IU/L Final         Passed - Completed PHQ-2 or PHQ-9 in the last 360 days

## 2023-11-13 ENCOUNTER — Ambulatory Visit: Admitting: Nurse Practitioner

## 2023-11-13 ENCOUNTER — Encounter: Payer: Self-pay | Admitting: Nurse Practitioner

## 2023-11-13 VITALS — BP 122/82 | HR 68 | Temp 98.2°F | Resp 15 | Ht 67.01 in | Wt 250.0 lb

## 2023-11-13 DIAGNOSIS — L309 Dermatitis, unspecified: Secondary | ICD-10-CM

## 2023-11-13 DIAGNOSIS — I10 Essential (primary) hypertension: Secondary | ICD-10-CM

## 2023-11-13 MED ORDER — TRIAMCINOLONE ACETONIDE 0.1 % EX CREA: 1.0000 | TOPICAL_CREAM | Freq: Two times a day (BID) | CUTANEOUS | 0 refills | Status: AC

## 2023-11-13 NOTE — Assessment & Plan Note (Signed)
 Chronic.  Controlled.  Continue with current medication regimen of Labetalol  200mg  BID.   Return to clinic in 6 months for reevaluation.  Call sooner if concerns arise.

## 2023-11-13 NOTE — Progress Notes (Signed)
 BP 122/82 (BP Location: Left Arm, Patient Position: Sitting, Cuff Size: Large)   Pulse 68   Temp 98.2 F (36.8 C) (Oral)   Resp 15   Ht 5' 7.01" (1.702 m)   Wt 250 lb (113.4 kg)   SpO2 98%   BMI 39.15 kg/m    Subjective:    Patient ID: Bailey Bolus, female    DOB: 23-Dec-1990, 33 y.o.   MRN: [id number]  HPI: Namiya Gargas is a 33 y.o. female  Chief Complaint  Patient presents with   Hypertension    No concerns for her currently.    Eczema    Under the right breast. Has been there awhile but wanted to see what could be used to help the times it gets raw.    HYPERTENSION without Chronic Kidney Disease Hypertension status: controlled  Satisfied with current treatment? yes Duration of hypertension: years BP monitoring frequency:  not checking BP range:  BP medication side effects:  no Medication compliance: excellent compliance Previous BP meds:labetalol  Aspirin : no Recurrent headaches: no Visual changes: no Palpitations: no Dyspnea: no Chest pain: no Lower extremity edema: no Dizzy/lightheaded: no   Patient has been having some eczema under her left breast.  Comes and goes.    Relevant past medical, surgical, family and social history reviewed and updated as indicated. Interim medical history since our last visit reviewed. Allergies and medications reviewed and updated.  Review of Systems  Eyes:  Negative for visual disturbance.  Respiratory:  Negative for cough, chest tightness and shortness of breath.   Cardiovascular:  Negative for chest pain, palpitations and leg swelling.  Skin:  Positive for rash.  Neurological:  Negative for dizziness and headaches.    Per HPI unless specifically indicated above     Objective:     BP 122/82 (BP Location: Left Arm, Patient Position: Sitting, Cuff Size: Large)   Pulse 68   Temp 98.2 F (36.8 C) (Oral)   Resp 15   Ht 5' 7.01" (1.702 m)   Wt 250 lb (113.4 kg)   SpO2 98%   BMI 39.15 kg/m   Wt  Readings from Last 3 Encounters:  11/13/23 250 lb (113.4 kg)  07/20/23 253 lb 9.6 oz (115 kg)  02/15/23 256 lb 12.8 oz (116.5 kg)    Physical Exam Vitals and nursing note reviewed.  Constitutional:      General: She is not in acute distress.    Appearance: Normal appearance. She is obese. She is not ill-appearing, toxic-appearing or diaphoretic.  HENT:     Head: Normocephalic.     Right Ear: External ear normal.     Left Ear: External ear normal.     Nose: Nose normal.     Mouth/Throat:     Mouth: Mucous membranes are moist.     Pharynx: Oropharynx is clear.  Eyes:     General:        Right eye: No discharge.        Left eye: No discharge.     Extraocular Movements: Extraocular movements intact.     Conjunctiva/sclera: Conjunctivae normal.     Pupils: Pupils are equal, round, and reactive to light.  Cardiovascular:     Rate and Rhythm: Normal rate and regular rhythm.     Heart sounds: No murmur heard. Pulmonary:     Effort: Pulmonary effort is normal. No respiratory distress.     Breath sounds: Normal breath sounds. No wheezing or rales.  Musculoskeletal:     Cervical  back: Normal range of motion and neck supple.  Skin:    General: Skin is warm and dry.     Capillary Refill: Capillary refill takes less than 2 seconds.  Neurological:     General: No focal deficit present.     Mental Status: She is alert and oriented to person, place, and time. Mental status is at baseline.  Psychiatric:        Mood and Affect: Mood normal.        Behavior: Behavior normal.        Thought Content: Thought content normal.        Judgment: Judgment normal.     Results for orders placed or performed in visit on 07/20/23  Comp Met (CMET)   Collection Time: 07/20/23  3:31 PM  Result Value Ref Range   Glucose 85 70 - 99 mg/dL   BUN 13 6 - 20 mg/dL   Creatinine, Ser 1.61 0.57 - 1.00 mg/dL   eGFR 096 >04 VW/UJW/1.19   BUN/Creatinine Ratio 18 9 - 23   Sodium 139 134 - 144 mmol/L    Potassium 4.6 3.5 - 5.2 mmol/L   Chloride 100 96 - 106 mmol/L   CO2 24 20 - 29 mmol/L   Calcium 9.7 8.7 - 10.2 mg/dL   Total Protein 7.1 6.0 - 8.5 g/dL   Albumin 4.1 3.9 - 4.9 g/dL   Globulin, Total 3.0 1.5 - 4.5 g/dL   Bilirubin Total <1.4 0.0 - 1.2 mg/dL   Alkaline Phosphatase 113 44 - 121 IU/L   AST 21 0 - 40 IU/L   ALT 33 (H) 0 - 32 IU/L  CBC   Collection Time: 07/20/23  3:31 PM  Result Value Ref Range   WBC 8.2 3.4 - 10.8 x10E3/uL   RBC 4.67 3.77 - 5.28 x10E6/uL   Hemoglobin 12.9 11.1 - 15.9 g/dL   Hematocrit 78.2 95.6 - 46.6 %   MCV 83 79 - 97 fL   MCH 27.6 26.6 - 33.0 pg   MCHC 33.2 31.5 - 35.7 g/dL   RDW 21.3 08.6 - 57.8 %   Platelets 366 150 - 450 x10E3/uL  Protein / Creatinine Ratio, Urine   Collection Time: 07/20/23  3:31 PM  Result Value Ref Range   Creatinine, Urine 58.3 Not Estab. mg/dL   Protein, Ur 46.9 Not Estab. mg/dL   Protein/Creat Ratio 629 (H) 0 - 200 mg/g creat      Assessment & Plan:   Problem List Items Addressed This Visit       Cardiovascular and Mediastinum   Essential hypertension - Primary   Chronic.  Controlled.  Continue with current medication regimen of Labetalol  200mg  BID.   Return to clinic in 6 months for reevaluation.  Call sooner if concerns arise.        Other Visit Diagnoses       Eczema, unspecified type       Will treat with Triamcinalone cream.  Follow up if not improved.        Follow up plan: Return in about 6 months (around 05/15/2024) for HTN, HLD, DM2 FU.

## 2023-12-20 ENCOUNTER — Telehealth: Admitting: Family Medicine

## 2023-12-20 DIAGNOSIS — B359 Dermatophytosis, unspecified: Secondary | ICD-10-CM

## 2023-12-20 MED ORDER — FLUCONAZOLE 150 MG PO TABS: 150.0000 mg | ORAL_TABLET | ORAL | 0 refills | Status: DC

## 2023-12-20 NOTE — Progress Notes (Signed)
 Virtual Visit Consent   Casey Richardson, you are scheduled for a virtual visit with a [facility] provider today. Just as with appointments in the office, your consent must be obtained to participate. Your consent will be active for this visit and any virtual visit you may have with one of our providers in the next 365 days. If you have a MyChart account, a copy of this consent can be sent to you electronically.  As this is a virtual visit, video technology does not allow for your provider to perform a traditional examination. This may limit your provider's ability to fully assess your condition. If your provider identifies any concerns that need to be evaluated in person or the need to arrange testing (such as labs, EKG, etc.), we will make arrangements to do so. Although advances in technology are sophisticated, we cannot ensure that it will always work on either your end or our end. If the connection with a video visit is poor, the visit may have to be switched to a telephone visit. With either a video or telephone visit, we are not always able to ensure that we have a secure connection.  By engaging in this virtual visit, you consent to the provision of healthcare and authorize for your insurance to be billed (if applicable) for the services provided during this visit. Depending on your insurance coverage, you may receive a charge related to this service.  I need to obtain your verbal consent now. Are you willing to proceed with your visit today? Casey Richardson has provided verbal consent on 12/20/2023 for a virtual visit (video or telephone). Casey Pion, NP  Date: 12/20/2023 2:11 PM   Virtual Visit via Video Note   I, Casey Richardson, connected with  Casey Richardson  ([id number], 1991/03/02) on 12/20/23 at  2:15 PM EDT by a video-enabled telemedicine application and verified that I am speaking with the correct person using two identifiers.  Location: Patient: Virtual Visit  Location Patient: Home Provider: Virtual Visit Location Provider: Home Office   I discussed the limitations of evaluation and management by telemedicine and the availability of in person appointments. The patient expressed understanding and agreed to proceed.    History of Present Illness: Casey Richardson is a 33 y.o. who identifies as a female who was assigned female at birth, and is being seen today for ringworm  Started a few days ago- has spots on arm left side and thigh of right leg. Itching. Unsure where she picked it up at.  Denies other rashes, fevers, chills, or drainage.  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/23/2020   Obesity in pregnancy 08/02/2020   Type AB blood, Rh positive 07/02/2020   Allergic rhinitis 11/26/2019   Essential hypertension 10/29/2019    Allergies: No Known Allergies Medications:  Current Outpatient Medications:    labetalol  (NORMODYNE ) 200 MG tablet, TAKE 2 TABLETS TWICE A DAY, Disp: 360 tablet, Rfl: 0   Prenatal Vit-Fe Fumarate-FA (PRENATAL VITAMINS PO), Take by mouth daily., Disp: , Rfl:    sertraline  (ZOLOFT ) 25 MG tablet, TAKE 1 TABLET(25 MG) BY MOUTH DAILY, Disp: 90 tablet, Rfl:  0   triamcinolone  cream (KENALOG ) 0.1 %, Apply 1 Application topically 2 (two) times daily., Disp: 60 g, Rfl: 0  Observations/Objective: Patient is well-developed, well-nourished in no acute distress.  Resting comfortably  at home.  Head is normocephalic, atraumatic.  No labored breathing.  Speech is clear and coherent with logical content.   Patient is alert and oriented at baseline.  Two spots one at left elbow area, and one on right thigh  Assessment and Plan:   1. Ringworm (Primary)  - fluconazole  (DIFLUCAN ) 150 MG tablet; Take 1 tablet (150 mg total) by mouth once a week.  Dispense: 2 tablet; Refill: 0   -info on prevent and spread discussed and on AVS -follow up if not improved -breastfeeding will watch for GI upset in baby  Reviewed side effects, risks and benefits of medication.    Patient acknowledged agreement and understanding of the plan.   Past Medical, Surgical, Social History, Allergies, and Medications have been Reviewed.    Follow Up Instructions: I discussed the assessment and treatment plan with the patient. The patient was provided an opportunity to ask questions and all were answered. The patient agreed with the plan and demonstrated an understanding of the instructions.  A copy of instructions were sent to the patient via MyChart unless otherwise noted below.   The patient was advised to call back or seek an in-person evaluation if the symptoms worsen or if the condition fails to improve as anticipated.    Casey Pion, NP

## 2023-12-20 NOTE — Patient Instructions (Addendum)
 Casey Richardson, thank you for joining Casey Pion, NP for today's virtual visit.  While this provider is not your primary care provider (PCP), if your PCP is located in our provider database this encounter information will be shared with them immediately following your visit.   A [facility] MyChart account gives you access to today's visit and all your visits, tests, and labs performed at [facility]  click here if you don't have a [facility] MyChart account or go to [email or web address]  Consent: (Patient) Casey Richardson provided verbal consent for this virtual visit at the beginning of the encounter.  Current Medications:  Current Outpatient Medications:    fluconazole  (DIFLUCAN ) 150 MG tablet, Take 1 tablet (150 mg total) by mouth once a week., Disp: 2 tablet, Rfl: 0   labetalol  (NORMODYNE ) 200 MG tablet, TAKE 2 TABLETS TWICE A DAY, Disp: 360 tablet, Rfl: 0   Prenatal Vit-Fe Fumarate-FA (PRENATAL VITAMINS PO), Take by mouth daily., Disp: , Rfl:    sertraline  (ZOLOFT ) 25 MG tablet, TAKE 1 TABLET(25 MG) BY MOUTH DAILY, Disp: 90 tablet, Rfl: 0   triamcinolone  cream (KENALOG ) 0.1 %, Apply 1 Application topically 2 (two) times daily., Disp: 60 g, Rfl: 0   Medications ordered in this encounter:  Meds ordered this encounter  Medications   fluconazole  (DIFLUCAN ) 150 MG tablet    Sig: Take 1 tablet (150 mg total) by mouth once a week.    Dispense:  2 tablet    Refill:  0    Supervising Provider:   LAMPTEY, PHILIP O [[id number]]     *If you need refills on other medications prior to your next appointment, please contact your pharmacy*  Follow-Up: Call back or seek an in-person evaluation if the symptoms worsen or if the condition fails to improve as anticipated.  [facility] [phone]  Other Instructions Body Ringworm Body ringworm is an infection of the skin that often causes a ring-shaped rash. Body ringworm is also called tinea  corporis. Body ringworm can affect any part of your skin. This condition is easily spread from person to person (is very contagious). What are the causes? This condition is caused by fungi called dermatophytes. The condition develops when these fungi grow out of control on the skin. You can get this condition if you touch a person or animal that has it. You can also get it if you share any items with an infected person or pet. These include: Clothing, bedding, and towels. Brushes or combs. Gym equipment. Any other object that has the fungus on it. What increases the risk? You are more likely to develop this condition if you: Play sports that involve close physical contact, such as wrestling. Sweat a lot. Live in areas that are hot and humid. Use public showers. Have a weakened disease-fighting system (immune system). What are the signs or symptoms? Symptoms of this condition include: Itchy, raised red spots and bumps. Red scaly patches. A ring-shaped rash. The rash may have: A clear center. Scales or red bumps at its center. Redness near its borders. Dry and scaly skin on or around it. How is this diagnosed? This condition can usually be diagnosed with a skin exam. A skin scraping may be taken from the affected area and examined under a microscope to see if the fungus is present. How is this treated? This condition may be treated with: An antifungal cream or ointment. An antifungal shampoo. Antifungal medicines. These may be prescribed if your ringworm: Is severe.  Keeps coming back or lasts a long time. Follow these instructions at home: Take over-the-counter and prescription medicines only as told by your health care provider. If you were given an antifungal cream or ointment: Use it as told by your health care provider. Wash the infected area and dry it completely before applying the cream or ointment. If you were given an antifungal shampoo: Use it as told by your health  care provider. Leave the shampoo on your body for 3-5 minutes before rinsing. While you have a rash: Wear loose clothing to stop clothes from rubbing and irritating it. Wash or change your bed sheets every night. Wash clothes and bed sheets in hot water. Disinfect or throw out items that may be infected. Wash your hands often with soap and water for at least 20 seconds. If soap and water are not available, use hand sanitizer. If your pet has the same infection, take your pet to see a veterinarian for treatment. How is this prevented? Take a bath or shower every day and after every time you work out or play sports. Dry your skin completely after bathing. Wear sandals or shoes in public places and showers. Wash athletic clothes after each use. Do not share personal items with others. Avoid touching red patches of skin on other people. Avoid touching pets that have bald spots. If you touch an animal that has a bald spot, wash your hands. Contact a health care provider if: Your rash continues to spread after 7 days of treatment. Your rash is not gone in 4 weeks. The area around your rash gets red, warm, tender, and swollen. This information is not intended to replace advice given to you by your health care provider. Make sure you discuss any questions you have with your health care provider. Document Revised: 12/01/2021 Document Reviewed: 12/01/2021 Elsevier Patient Education  2024 Elsevier Inc.   If you have been instructed to have an in-person evaluation today at a local Urgent Care facility, please use the link below. It will take you to a list of all of our available [facility], including address, phone number and hours of operation. Please do not delay care.  [facility]  If you or a family member do not have a primary care provider, use the link below to schedule a visit and establish care. When you choose a [facility] primary care physician or advanced  practice provider, you gain a long-term partner in health. Find a Primary Care Provider  Learn more about [facility]'s in-office and virtual care options: [facility] - Get Care Now

## 2024-01-28 ENCOUNTER — Other Ambulatory Visit: Payer: Self-pay | Admitting: Nurse Practitioner

## 2024-01-29 NOTE — Telephone Encounter (Signed)
 Requested Prescriptions  Pending Prescriptions Disp Refills   sertraline  (ZOLOFT ) 25 MG tablet [Pharmacy Med Name: SERTRALINE  25MG  TABLETS] 90 tablet 0    Sig: TAKE 1 TABLET(25 MG) BY MOUTH DAILY     Psychiatry:  Antidepressants - SSRI - sertraline  Failed - 01/29/2024  2:29 PM      Failed - ALT in normal range and within 360 days    ALT  Date Value Ref Range Status  07/20/2023 33 (H) 0 - 32 IU/L Final         Failed - Valid encounter within last 6 months    Recent Outpatient Visits           2 months ago Essential hypertension   [place] [facility] Melvin Pao, NP              Passed - AST in normal range and within 360 days    AST  Date Value Ref Range Status  07/20/2023 21 0 - 40 IU/L Final         Passed - Completed PHQ-2 or PHQ-9 in the last 360 days

## 2024-02-14 ENCOUNTER — Other Ambulatory Visit: Payer: Self-pay | Admitting: Nurse Practitioner

## 2024-02-14 NOTE — Telephone Encounter (Unsigned)
 Copied from CRM [id number]. Topic: Clinical - Medication Refill >> Feb 14, 2024 10:19 AM Tiffini S wrote: Medication: sertraline  (ZOLOFT ) 25 MG tablet, please refill as a 90 day supply   Has the patient contacted their pharmacy? Yes, was told call pcp  (Agent: If no, request that the patient contact the pharmacy for the refill. If patient does not wish to contact the pharmacy document the reason why and proceed with request.) (Agent: If yes, when and what did the pharmacy advise?)  This is the patient's preferred pharmacy:  [id number] [place],  - [address] AT [facility] OF [address] [place] [address] [place] [address] Phone: [phone] Fax: [phone]  Is this the correct pharmacy for this prescription? Yes If no, delete pharmacy and type the correct one.   Has the prescription been filled recently? Yes  Is the patient out of the medication? Yes, patient have one week left in medication   Has the patient been seen for an appointment in the last year OR does the patient have an upcoming appointment? Yes  Can we respond through MyChart? Yes  Agent: Please be advised that Rx refills may take up to 3 business days. We ask that you follow-up with your pharmacy.

## 2024-02-18 NOTE — Telephone Encounter (Signed)
 Requested Prescriptions  Refused Prescriptions Disp Refills   sertraline  (ZOLOFT ) 25 MG tablet 90 tablet 0     Psychiatry:  Antidepressants - SSRI - sertraline  Failed - 02/18/2024  1:21 PM      Failed - ALT in normal range and within 360 days    ALT  Date Value Ref Range Status  07/20/2023 33 (H) 0 - 32 IU/L Final         Failed - Valid encounter within last 6 months    Recent Outpatient Visits           3 months ago Essential hypertension   [facility] Melvin Pao, NP              Passed - AST in normal range and within 360 days    AST  Date Value Ref Range Status  07/20/2023 21 0 - 40 IU/L Final         Passed - Completed PHQ-2 or PHQ-9 in the last 360 days

## 2024-02-18 NOTE — Telephone Encounter (Signed)
 Called pharmacy - pt did not p/u medication that was ordered 01/29/2024. Pharmacy will prepare medication. It will be ready for pick up after 2 on weds.

## 2024-05-09 ENCOUNTER — Telehealth: Payer: Self-pay | Admitting: *Deleted

## 2024-05-09 DIAGNOSIS — I10 Essential (primary) hypertension: Secondary | ICD-10-CM

## 2024-05-09 NOTE — Progress Notes (Signed)
 Complex Care Management Note  Care Guide Note 05/09/2024 Name: Casey Richardson MRN: [id number] DOB: 12/18/1990  Casey Richardson is a 33 y.o. year old female who sees Melvin Pao, NP for primary care. I reached out to Ethie Salih by phone today to offer complex care management services.  [name] was given information about Complex Care Management services today including:   The Complex Care Management services include support from the care team which includes your Nurse Care Manager, Clinical Social Worker, or Pharmacist.  The Complex Care Management team is here to help remove barriers to the health concerns and goals most important to you. Complex Care Management services are voluntary, and the patient may decline or stop services at any time by request to their care team member.   Complex Care Management Consent Status: Patient did not agree to participate in complex care management services at this time.  Encounter Outcome:  Patient Refused  Thedford Franks, CMA [place]  [facility], [facility] Guide Direct Dial: [phone]  Fax: [phone] Website: [email or web address]

## 2024-05-16 ENCOUNTER — Ambulatory Visit: Admitting: Nurse Practitioner

## 2024-05-16 ENCOUNTER — Encounter: Payer: Self-pay | Admitting: Nurse Practitioner

## 2024-05-16 VITALS — BP 123/78 | HR 70 | Temp 98.3°F | Ht 67.0 in | Wt 263.4 lb

## 2024-05-16 DIAGNOSIS — O99345 Other mental disorders complicating the puerperium: Secondary | ICD-10-CM

## 2024-05-16 DIAGNOSIS — I10 Essential (primary) hypertension: Secondary | ICD-10-CM

## 2024-05-16 NOTE — Progress Notes (Deleted)
 There were no vitals taken for this visit.   Subjective:    Patient ID: Casey Richardson, female    DOB: 1991/02/14, 33 y.o.   MRN: [id number]  HPI: Casey Richardson is a 33 y.o. female presenting on 05/16/2024 for comprehensive medical examination. Current medical complaints include:{Blank single:19197::none,***}  She currently lives with: Menopausal Symptoms: {Blank single:19197::yes,no}   HYPERTENSION without Chronic Kidney Disease Hypertension status: controlled  Satisfied with current treatment? yes Duration of hypertension: years BP monitoring frequency:  not checking BP range:  BP medication side effects:  no Medication compliance: excellent compliance Previous BP meds:labetalol  Aspirin : no Recurrent headaches: no Visual changes: no Palpitations: no Dyspnea: no Chest pain: no Lower extremity edema: no Dizzy/lightheaded: no  Depression Screen done today and results listed below:     11/13/2023   10:58 AM 07/30/2023    1:07 PM 07/20/2023    2:56 PM 02/15/2023    9:06 AM 07/24/2022    9:06 AM  Depression screen PHQ 2/9  Decreased Interest 0 0 0 0 0  Down, Depressed, Hopeless 0 0 1 0 0  PHQ - 2 Score 0 0 1 0 0  Altered sleeping 0 0 0 1 0  Tired, decreased energy 0 0 1 1 0  Change in appetite 0 0 0 0 0  Feeling bad or failure about yourself  0 0 0 0 0  Trouble concentrating 0 0 0 0 0  Moving slowly or fidgety/restless 0 0 0 0 0  Suicidal thoughts 0 0 0 0 0  PHQ-9 Score 0  0  2  2  0   Difficult doing work/chores    Not difficult at all Not difficult at all     Data saved with a previous flowsheet row definition    The patient {has/does not yjcz:80150} a history of falls. I {did/did not:19850} complete a risk assessment for falls. A plan of care for falls {was/was not:19852} documented.   Past Medical History:  Past Medical History:  Diagnosis Date   Hearing loss    right    HTN (hypertension)    Hypertension     Surgical History:  Past  Surgical History:  Procedure Laterality Date   APPENDECTOMY  1999   APPENDECTOMY     20 years   CESAREAN SECTION     EAR CYST EXCISION     nasal polyps      Medications:  Current Outpatient Medications on File Prior to Visit  Medication Sig   fluconazole  (DIFLUCAN ) 150 MG tablet Take 1 tablet (150 mg total) by mouth once a week.   labetalol  (NORMODYNE ) 200 MG tablet TAKE 2 TABLETS TWICE A DAY   Prenatal Vit-Fe Fumarate-FA (PRENATAL VITAMINS PO) Take by mouth daily.   sertraline  (ZOLOFT ) 25 MG tablet TAKE 1 TABLET(25 MG) BY MOUTH DAILY   triamcinolone  cream (KENALOG ) 0.1 % Apply 1 Application topically 2 (two) times daily.   No current facility-administered medications on file prior to visit.    Allergies:  No Known Allergies  Social History:  Social History   Socioeconomic History   Marital status: Married    Spouse name: Not on file   Number of children: Not on file   Years of education: Not on file   Highest education level: Master's degree (e.g., MA, MS, MEng, MEd, MSW, MBA)  Occupational History   Not on file  Tobacco Use   Smoking status: Never   Smokeless tobacco: Never  Vaping Use   Vaping status: Never Used  Substance and Sexual Activity   Alcohol use: Not Currently    Comment: rare   Drug use: Never   Sexual activity: Yes    Birth control/protection: None  Other Topics Concern   Not on file  Social History Narrative   ** Merged History Encounter **       Social Drivers of Health   Financial Resource Strain: Low Risk  (05/13/2024)   Overall Financial Resource Strain (CARDIA)    Difficulty of Paying Living Expenses: Not very hard  Food Insecurity: No Food Insecurity (05/13/2024)   Hunger Vital Sign    Worried About Running Out of Food in the Last Year: Never true    Ran Out of Food in the Last Year: Never true  Transportation Needs: No Transportation Needs (05/13/2024)   PRAPARE - Administrator, Civil Service (Medical): No    Lack of  Transportation (Non-Medical): No  Physical Activity: Insufficiently Active (05/13/2024)   Exercise Vital Sign    Days of Exercise per Week: 3 days    Minutes of Exercise per Session: 40 min  Stress: No Stress Concern Present (05/13/2024)   [employer] of Occupational Health - Occupational Stress Questionnaire    Feeling of Stress: Not at all  Social Connections: Socially Integrated (05/13/2024)   Social Connection and Isolation Panel    Frequency of Communication with Friends and Family: More than three times a week    Frequency of Social Gatherings with Friends and Family: Three times a week    Attends Religious Services: More than 4 times per year    Active Member of Clubs or Organizations: Yes    Attends Engineer, Structural: More than 4 times per year    Marital Status: Married  Catering Manager Violence: Not on file   Social History   Tobacco Use  Smoking Status Never  Smokeless Tobacco Never   Social History   Substance and Sexual Activity  Alcohol Use Not Currently   Comment: rare    Family History:  Family History  Problem Relation Age of Onset   Hyperlipidemia Mother    Hashimoto's thyroiditis Mother    Hypothyroidism Mother    Anxiety disorder Brother    Post-traumatic stress disorder Brother    Heart defect Daughter    Down syndrome Daughter    Cancer Maternal Uncle    Aneurysm Maternal Uncle    Arthritis Maternal Grandmother    Cancer Maternal Grandfather    Cancer Paternal Grandmother    Anemia Paternal Grandmother    Cancer Paternal Grandfather    Thyroid disease Mother    Esophageal cancer Maternal Grandfather    Colon cancer Paternal Grandmother    Esophageal cancer Paternal Grandfather    Hypertension Paternal Grandfather    Ovarian cancer Cousin 30    Past medical history, surgical history, medications, allergies, family history and social history reviewed with patient today and changes made to appropriate areas of the chart.    ROS All other ROS negative except what is listed above and in the HPI.      Objective:    There were no vitals taken for this visit.  Wt Readings from Last 3 Encounters:  11/13/23 250 lb (113.4 kg)  07/20/23 253 lb 9.6 oz (115 kg)  02/15/23 256 lb 12.8 oz (116.5 kg)    Physical Exam  Results for orders placed or performed in visit on 07/20/23  Comp Met (CMET)   Collection Time: 07/20/23  3:31 PM  Result  Value Ref Range   Glucose 85 70 - 99 mg/dL   BUN 13 6 - 20 mg/dL   Creatinine, Ser 9.27 0.57 - 1.00 mg/dL   eGFR 887 >40 fO/fpw/8.26   BUN/Creatinine Ratio 18 9 - 23   Sodium 139 134 - 144 mmol/L   Potassium 4.6 3.5 - 5.2 mmol/L   Chloride 100 96 - 106 mmol/L   CO2 24 20 - 29 mmol/L   Calcium 9.7 8.7 - 10.2 mg/dL   Total Protein 7.1 6.0 - 8.5 g/dL   Albumin 4.1 3.9 - 4.9 g/dL   Globulin, Total 3.0 1.5 - 4.5 g/dL   Bilirubin Total <9.7 0.0 - 1.2 mg/dL   Alkaline Phosphatase 113 44 - 121 IU/L   AST 21 0 - 40 IU/L   ALT 33 (H) 0 - 32 IU/L  CBC   Collection Time: 07/20/23  3:31 PM  Result Value Ref Range   WBC 8.2 3.4 - 10.8 x10E3/uL   RBC 4.67 3.77 - 5.28 x10E6/uL   Hemoglobin 12.9 11.1 - 15.9 g/dL   Hematocrit 61.1 65.9 - 46.6 %   MCV 83 79 - 97 fL   MCH 27.6 26.6 - 33.0 pg   MCHC 33.2 31.5 - 35.7 g/dL   RDW 85.1 88.2 - 84.5 %   Platelets 366 150 - 450 x10E3/uL  Protein / Creatinine Ratio, Urine   Collection Time: 07/20/23  3:31 PM  Result Value Ref Range   Creatinine, Urine 58.3 Not Estab. mg/dL   Protein, Ur 81.0 Not Estab. mg/dL   Protein/Creat Ratio 675 (H) 0 - 200 mg/g creat      Assessment & Plan:   Problem List Items Addressed This Visit       Cardiovascular and Mediastinum   Essential hypertension - Primary     Other   Hypercholesteremia     Follow up plan: No follow-ups on file.   LABORATORY TESTING:  - Pap smear: {Blank single:19197::pap done,not applicable,up to date,done elsewhere}  IMMUNIZATIONS:   - Tdap: Tetanus  vaccination status reviewed: {tetanus status:315746}. - Influenza: {Blank single:19197::Up to date,Administered today,Postponed to flu season,Refused,Given elsewhere} - Pneumovax: {Blank single:19197::Up to date,Administered today,Not applicable,Refused,Given elsewhere} - Prevnar: {Blank single:19197::Up to date,Administered today,Not applicable,Refused,Given elsewhere} - COVID: {Blank single:19197::Up to date,Administered today,Not applicable,Refused,Given elsewhere} - HPV: {Blank single:19197::Up to date,Administered today,Not applicable,Refused,Given elsewhere} - Shingrix vaccine: {Blank single:19197::Up to date,Administered today,Not applicable,Refused,Given elsewhere}  SCREENING: -Mammogram: {Blank single:19197::Up to date,Ordered today,Not applicable,Refused,Done elsewhere}  - Colonoscopy: {Blank single:19197::Up to date,Ordered today,Not applicable,Refused,Done elsewhere}  - Bone Density: {Blank single:19197::Up to date,Ordered today,Not applicable,Refused,Done elsewhere}  -Hearing Test: {Blank single:19197::Up to date,Ordered today,Not applicable,Refused,Done elsewhere}  -Spirometry: {Blank single:19197::Up to date,Ordered today,Not applicable,Refused,Done elsewhere}   PATIENT COUNSELING:   Advised to take 1 mg of folate supplement per day if capable of pregnancy.   Sexuality: Discussed sexually transmitted diseases, partner selection, use of condoms, avoidance of unintended pregnancy  and contraceptive alternatives.   Advised to avoid cigarette smoking.  I discussed with the patient that most people either abstain from alcohol or drink within safe limits (<=14/week and <=4 drinks/occasion for males, <=7/weeks and <= 3 drinks/occasion for females) and that the risk for alcohol disorders and other health effects rises proportionally with the number of drinks per week and how  often a drinker exceeds daily limits.  Discussed cessation/primary prevention of drug use and availability of treatment for abuse.   Diet: Encouraged to adjust caloric intake to maintain  or achieve ideal body weight, to reduce intake  of dietary saturated fat and total fat, to limit sodium intake by avoiding high sodium foods and not adding table salt, and to maintain adequate dietary potassium and calcium preferably from fresh fruits, vegetables, and low-fat dairy products.    stressed the importance of regular exercise  Injury prevention: Discussed safety belts, safety helmets, smoke detector, smoking near bedding or upholstery.   Dental health: Discussed importance of regular tooth brushing, flossing, and dental visits.    NEXT PREVENTATIVE PHYSICAL DUE IN 1 YEAR. No follow-ups on file.

## 2024-05-16 NOTE — Progress Notes (Signed)
 BP 123/78   Pulse 70   Temp 98.3 F (36.8 C) (Oral)   Ht 5' 7 (1.702 m)   Wt 263 lb 6.4 oz (119.5 kg)   SpO2 97%   BMI 41.25 kg/m    Subjective:    Patient ID: Casey Richardson, female    DOB: 10/06/1990, 33 y.o.   MRN: [id number]  HPI: Casey Richardson is a 33 y.o. female  Chief Complaint  Patient presents with   Hypertension   Hyperlipidemia   HYPERTENSION without Chronic Kidney Disease Hypertension status: controlled  Satisfied with current treatment? yes Duration of hypertension: years BP monitoring frequency:  not checking BP range:  BP medication side effects:  no Medication compliance: excellent compliance Previous BP meds:labetalol  Aspirin : no Recurrent headaches: no Visual changes: no Palpitations: no Dyspnea: no Chest pain: no Lower extremity edema: no Dizzy/lightheaded: no  MOOD Patient states her mood has been pretty good.  This has been a hard month, her mother in law passed away unexpectedly.  She feels like the Zoloft  is still working well for her.  Denies concerns regarding medication at visit today.    Relevant past medical, surgical, family and social history reviewed and updated as indicated. Interim medical history since our last visit reviewed. Allergies and medications reviewed and updated.  Review of Systems  Eyes:  Negative for visual disturbance.  Respiratory:  Negative for cough, chest tightness and shortness of breath.   Cardiovascular:  Negative for chest pain, palpitations and leg swelling.  Neurological:  Negative for dizziness and headaches.  Psychiatric/Behavioral:  Positive for dysphoric mood. Negative for suicidal ideas. The patient is nervous/anxious.     Per HPI unless specifically indicated above     Objective:    BP 123/78   Pulse 70   Temp 98.3 F (36.8 C) (Oral)   Ht 5' 7 (1.702 m)   Wt 263 lb 6.4 oz (119.5 kg)   SpO2 97%   BMI 41.25 kg/m   Wt Readings from Last 3 Encounters:  05/16/24 263 lb 6.4 oz  (119.5 kg)  11/13/23 250 lb (113.4 kg)  07/20/23 253 lb 9.6 oz (115 kg)    Physical Exam Vitals and nursing note reviewed.  Constitutional:      General: She is not in acute distress.    Appearance: Normal appearance. She is not ill-appearing, toxic-appearing or diaphoretic.  HENT:     Head: Normocephalic.     Right Ear: External ear normal.     Left Ear: External ear normal.     Nose: Nose normal.     Mouth/Throat:     Mouth: Mucous membranes are moist.     Pharynx: Oropharynx is clear.  Eyes:     General:        Right eye: No discharge.        Left eye: No discharge.     Extraocular Movements: Extraocular movements intact.     Conjunctiva/sclera: Conjunctivae normal.     Pupils: Pupils are equal, round, and reactive to light.  Cardiovascular:     Rate and Rhythm: Normal rate and regular rhythm.     Heart sounds: No murmur heard. Pulmonary:     Effort: Pulmonary effort is normal. No respiratory distress.     Breath sounds: Normal breath sounds. No wheezing or rales.  Musculoskeletal:     Cervical back: Normal range of motion and neck supple.  Skin:    General: Skin is warm and dry.     Capillary Refill: Capillary  refill takes less than 2 seconds.  Neurological:     General: No focal deficit present.     Mental Status: She is alert and oriented to person, place, and time. Mental status is at baseline.  Psychiatric:        Mood and Affect: Mood normal.        Behavior: Behavior normal.        Thought Content: Thought content normal.        Judgment: Judgment normal.     Results for orders placed or performed in visit on 07/20/23  Comp Met (CMET)   Collection Time: 07/20/23  3:31 PM  Result Value Ref Range   Glucose 85 70 - 99 mg/dL   BUN 13 6 - 20 mg/dL   Creatinine, Ser 9.27 0.57 - 1.00 mg/dL   eGFR 887 >40 fO/fpw/8.26   BUN/Creatinine Ratio 18 9 - 23   Sodium 139 134 - 144 mmol/L   Potassium 4.6 3.5 - 5.2 mmol/L   Chloride 100 96 - 106 mmol/L   CO2 24 20 -  29 mmol/L   Calcium 9.7 8.7 - 10.2 mg/dL   Total Protein 7.1 6.0 - 8.5 g/dL   Albumin 4.1 3.9 - 4.9 g/dL   Globulin, Total 3.0 1.5 - 4.5 g/dL   Bilirubin Total <9.7 0.0 - 1.2 mg/dL   Alkaline Phosphatase 113 44 - 121 IU/L   AST 21 0 - 40 IU/L   ALT 33 (H) 0 - 32 IU/L  CBC   Collection Time: 07/20/23  3:31 PM  Result Value Ref Range   WBC 8.2 3.4 - 10.8 x10E3/uL   RBC 4.67 3.77 - 5.28 x10E6/uL   Hemoglobin 12.9 11.1 - 15.9 g/dL   Hematocrit 61.1 65.9 - 46.6 %   MCV 83 79 - 97 fL   MCH 27.6 26.6 - 33.0 pg   MCHC 33.2 31.5 - 35.7 g/dL   RDW 85.1 88.2 - 84.5 %   Platelets 366 150 - 450 x10E3/uL  Protein / Creatinine Ratio, Urine   Collection Time: 07/20/23  3:31 PM  Result Value Ref Range   Creatinine, Urine 58.3 Not Estab. mg/dL   Protein, Ur 81.0 Not Estab. mg/dL   Protein/Creat Ratio 675 (H) 0 - 200 mg/g creat      Assessment & Plan:   Problem List Items Addressed This Visit       Cardiovascular and Mediastinum   Essential hypertension - Primary   Chronic.  Controlled.  Continue with current medication regimen of Labetalol  400mg  BID.  Refills sent today.  Recommend checking blood pressures at home and bringing log to next visit.  Labs ordered today.  Return to clinic in 6 months for reevaluation.  Call sooner if concerns arise.        Relevant Orders   Comprehensive metabolic panel with GFR     Other   Postpartum anxiety   Chronic.  Controlled.  Continue with current medication regimen of Zoloft  25mg  daily.  Refills sent today.  Labs ordered today.  Return to clinic in 6 months for reevaluation.  Call sooner if concerns arise.           Follow up plan: Return in about 6 months (around 11/13/2024) for Physical and Fasting labs.

## 2024-05-16 NOTE — Assessment & Plan Note (Signed)
Chronic.  Controlled.  Continue with current medication regimen of Zoloft 25mg daily.  Refills sent today.  Labs ordered today.  Return to clinic in 6 months for reevaluation.  Call sooner if concerns arise.   

## 2024-05-16 NOTE — Assessment & Plan Note (Signed)
 Chronic.  Controlled.  Continue with current medication regimen of Labetalol  400mg  BID.  Refills sent today.  Recommend checking blood pressures at home and bringing log to next visit.  Labs ordered today.  Return to clinic in 6 months for reevaluation.  Call sooner if concerns arise.

## 2024-05-17 LAB — COMPREHENSIVE METABOLIC PANEL WITH GFR
ALT: 17 IU/L (ref 0–32)
AST: 15 IU/L (ref 0–40)
Albumin: 4.6 g/dL (ref 3.9–4.9)
Alkaline Phosphatase: 86 IU/L (ref 41–116)
BUN/Creatinine Ratio: 19 (ref 9–23)
BUN: 12 mg/dL (ref 6–20)
Bilirubin Total: 0.2 mg/dL (ref 0.0–1.2)
CO2: 23 mmol/L (ref 20–29)
Calcium: 9.6 mg/dL (ref 8.7–10.2)
Chloride: 101 mmol/L (ref 96–106)
Creatinine, Ser: 0.63 mg/dL (ref 0.57–1.00)
Globulin, Total: 2.6 g/dL (ref 1.5–4.5)
Glucose: 99 mg/dL (ref 70–99)
Potassium: 4.5 mmol/L (ref 3.5–5.2)
Sodium: 140 mmol/L (ref 134–144)
Total Protein: 7.2 g/dL (ref 6.0–8.5)
eGFR: 119 mL/min/1.73 (ref >59)

## 2024-05-19 ENCOUNTER — Ambulatory Visit: Payer: Self-pay | Admitting: Nurse Practitioner

## 2024-05-21 ENCOUNTER — Other Ambulatory Visit: Payer: Self-pay | Admitting: Nurse Practitioner

## 2024-05-21 NOTE — Telephone Encounter (Unsigned)
 Copied from CRM #[id number]. Topic: Clinical - Medication Refill >> May 21, 2024  8:07 AM Fonda T wrote: Medication: sertraline  (ZOLOFT ) 25 MG tablet  Has the patient contacted their pharmacy? Yes, Robert at pharmacy calling to check status of medication refill for pt, sates initial request was already sent to office.   This is the patient's preferred pharmacy:  [id number] - [place], [place] - [address] AT [facility] OF [address] [place] [address] [place] [address] Phone: [phone] Fax: [phone]  Is this the correct pharmacy for this prescription? Yes If no, delete pharmacy and type the correct one.   Has the prescription been filled recently? Yes  Is the patient out of the medication? Yes  Has the patient been seen for an appointment in the last year OR does the patient have an upcoming appointment? Yes  Can we respond through MyChart? No  Agent: Please be advised that Rx refills may take up to 3 business days. We ask that you follow-up with your pharmacy.

## 2024-05-23 ENCOUNTER — Telehealth: Payer: Self-pay

## 2024-05-23 MED ORDER — SERTRALINE HCL 25 MG PO TABS
25.0000 mg | ORAL_TABLET | Freq: Every day | ORAL | 0 refills | Status: AC
Start: 2024-05-23 — End: ?

## 2024-05-23 NOTE — Telephone Encounter (Signed)
 Requested by patient. Future visit 11/14/24.  Requested Prescriptions  Pending Prescriptions Disp Refills   sertraline  (ZOLOFT ) 25 MG tablet 90 tablet 0    Sig: Take 1 tablet (25 mg total) by mouth daily.     Psychiatry:  Antidepressants - SSRI - sertraline  Passed - 05/23/2024  1:20 PM      Passed - AST in normal range and within 360 days    AST  Date Value Ref Range Status  05/16/2024 15 0 - 40 IU/L Final         Passed - ALT in normal range and within 360 days    ALT  Date Value Ref Range Status  05/16/2024 17 0 - 32 IU/L Final         Passed - Completed PHQ-2 or PHQ-9 in the last 360 days      Passed - Valid encounter within last 6 months    Recent Outpatient Visits           1 week ago Essential hypertension   [place] [facility] Melvin Pao, NP   6 months ago Essential hypertension   [place] [facility] Melvin Pao, NP

## 2024-05-23 NOTE — Telephone Encounter (Signed)
 Copied from CRM #[id number]. Topic: Clinical - Medication Refill >> May 21, 2024  8:07 AM Fonda T wrote: Medication: sertraline  (ZOLOFT ) 25 MG tablet  Has the patient contacted their pharmacy? Yes, Robert at pharmacy calling to check status of medication refill for pt, sates initial request was already sent to office.   This is the patient's preferred pharmacy:  [id number] - [place], [place] - [address] AT [facility] OF [address] [place] [address] [place] [address] Phone: [phone] Fax: [phone]  Is this the correct pharmacy for this prescription? Yes If no, delete pharmacy and type the correct one.   Has the prescription been filled recently? Yes  Is the patient out of the medication? Yes  Has the patient been seen for an appointment in the last year OR does the patient have an upcoming appointment? Yes  Can we respond through MyChart? No  Agent: Please be advised that Rx refills may take up to 3 business days. We ask that you follow-up with your pharmacy. >> May 23, 2024 12:49 PM Winona R wrote: Pt only has two days left and is going out of town from Sat -Tuesday. Pt will need this filled asap

## 2024-05-23 NOTE — Telephone Encounter (Signed)
 Medication sent in this morning by provider. Called and LVM notifying patient that medication has been sent in earlier today.

## 2024-07-31 ENCOUNTER — Other Ambulatory Visit: Payer: Self-pay | Admitting: Nurse Practitioner

## 2024-07-31 NOTE — Telephone Encounter (Signed)
 Rx 05/23/24 #90- too soon Requested Prescriptions  Pending Prescriptions Disp Refills   sertraline  (ZOLOFT ) 25 MG tablet [Pharmacy Med Name: SERTRALINE  25MG  TABLETS] 90 tablet 0    Sig: TAKE 1 TABLET(25 MG) BY MOUTH DAILY     Psychiatry:  Antidepressants - SSRI - sertraline  Passed - 07/31/2024  4:21 PM      Passed - AST in normal range and within 360 days    AST  Date Value Ref Range Status  05/16/2024 15 0 - 40 IU/L Final         Passed - ALT in normal range and within 360 days    ALT  Date Value Ref Range Status  05/16/2024 17 0 - 32 IU/L Final         Passed - Completed PHQ-2 or PHQ-9 in the last 360 days      Passed - Valid encounter within last 6 months    Recent Outpatient Visits           2 months ago Essential hypertension   [place] [facility] Melvin Pao, NP   8 months ago Essential hypertension   [place] [facility] Melvin Pao, NP

## 2024-11-14 ENCOUNTER — Encounter: Admitting: Nurse Practitioner
